# Patient Record
Sex: Female | Born: 1950
Health system: Southern US, Community
[De-identification: ages and names within clinical notes are randomized; demographics above are authoritative.]

## PROBLEM LIST (undated history)

## (undated) DIAGNOSIS — F419 Anxiety disorder, unspecified: Secondary | ICD-10-CM

## (undated) DIAGNOSIS — R002 Palpitations: Secondary | ICD-10-CM

## (undated) DIAGNOSIS — I447 Left bundle-branch block, unspecified: Secondary | ICD-10-CM

## (undated) DIAGNOSIS — K219 Gastro-esophageal reflux disease without esophagitis: Secondary | ICD-10-CM

## (undated) DIAGNOSIS — J45909 Unspecified asthma, uncomplicated: Secondary | ICD-10-CM

## (undated) DIAGNOSIS — J302 Other seasonal allergic rhinitis: Secondary | ICD-10-CM

## (undated) HISTORY — DX: Left bundle-branch block, unspecified: I44.7

## (undated) HISTORY — PX: EYE SURGERY: SHX253

## (undated) HISTORY — DX: Other seasonal allergic rhinitis: J30.2

## (undated) HISTORY — DX: Unspecified asthma, uncomplicated: J45.909

## (undated) HISTORY — DX: Gastro-esophageal reflux disease without esophagitis: K21.9

## (undated) HISTORY — DX: Palpitations: R00.2

---

## 1998-09-24 ENCOUNTER — Other Ambulatory Visit: Admission: RE | Admit: 1998-09-24 | Discharge: 1998-09-24 | Payer: Self-pay | Admitting: Obstetrics & Gynecology

## 1999-10-15 ENCOUNTER — Other Ambulatory Visit: Admission: RE | Admit: 1999-10-15 | Discharge: 1999-10-15 | Payer: Self-pay | Admitting: Obstetrics & Gynecology

## 2000-10-26 ENCOUNTER — Other Ambulatory Visit: Admission: RE | Admit: 2000-10-26 | Discharge: 2000-10-26 | Payer: Self-pay | Admitting: Obstetrics & Gynecology

## 2001-10-31 ENCOUNTER — Other Ambulatory Visit: Admission: RE | Admit: 2001-10-31 | Discharge: 2001-10-31 | Payer: Self-pay | Admitting: Obstetrics & Gynecology

## 2002-11-10 ENCOUNTER — Other Ambulatory Visit: Admission: RE | Admit: 2002-11-10 | Discharge: 2002-11-10 | Payer: Self-pay | Admitting: Obstetrics & Gynecology

## 2003-11-14 ENCOUNTER — Other Ambulatory Visit: Admission: RE | Admit: 2003-11-14 | Discharge: 2003-11-14 | Payer: Self-pay | Admitting: Obstetrics & Gynecology

## 2004-12-09 ENCOUNTER — Other Ambulatory Visit: Admission: RE | Admit: 2004-12-09 | Discharge: 2004-12-09 | Payer: Self-pay | Admitting: Obstetrics & Gynecology

## 2008-03-26 HISTORY — PX: OTHER SURGICAL HISTORY: SHX169

## 2009-08-28 HISTORY — PX: OTHER SURGICAL HISTORY: SHX169

## 2012-11-07 ENCOUNTER — Encounter: Payer: Self-pay | Admitting: Cardiology

## 2012-11-08 ENCOUNTER — Encounter: Payer: Self-pay | Admitting: Cardiology

## 2012-11-08 ENCOUNTER — Ambulatory Visit (INDEPENDENT_AMBULATORY_CARE_PROVIDER_SITE_OTHER): Payer: 59 | Admitting: Cardiology

## 2012-11-08 VITALS — BP 110/70 | HR 80 | Ht 68.0 in | Wt 149.1 lb

## 2012-11-08 DIAGNOSIS — I447 Left bundle-branch block, unspecified: Secondary | ICD-10-CM

## 2012-11-08 DIAGNOSIS — I10 Essential (primary) hypertension: Secondary | ICD-10-CM

## 2012-11-08 DIAGNOSIS — J45909 Unspecified asthma, uncomplicated: Secondary | ICD-10-CM

## 2012-11-08 DIAGNOSIS — R0609 Other forms of dyspnea: Secondary | ICD-10-CM

## 2012-11-08 DIAGNOSIS — E785 Hyperlipidemia, unspecified: Secondary | ICD-10-CM

## 2012-11-08 DIAGNOSIS — J309 Allergic rhinitis, unspecified: Secondary | ICD-10-CM

## 2012-11-08 DIAGNOSIS — J302 Other seasonal allergic rhinitis: Secondary | ICD-10-CM

## 2012-11-08 NOTE — Patient Instructions (Signed)
I agree with you -- I think your shortness of breath is "allergy" and reative airway disease related.  I think we need to let the treatment that your PCP started run its course.  We can reassess in ~ 3 months.  If you are still having symptoms then, I would consider a stress test evaluation.  You are due for having your cholesterol levels checked -- I have ordered them.  You can have them drawn @ the lab downstairs.   Marykay Lex, MD

## 2012-11-13 ENCOUNTER — Encounter: Payer: Self-pay | Admitting: Cardiology

## 2012-11-13 DIAGNOSIS — J45901 Unspecified asthma with (acute) exacerbation: Secondary | ICD-10-CM | POA: Insufficient documentation

## 2012-11-13 DIAGNOSIS — J302 Other seasonal allergic rhinitis: Secondary | ICD-10-CM | POA: Insufficient documentation

## 2012-11-13 DIAGNOSIS — I10 Essential (primary) hypertension: Secondary | ICD-10-CM | POA: Insufficient documentation

## 2012-11-13 DIAGNOSIS — I447 Left bundle-branch block, unspecified: Secondary | ICD-10-CM | POA: Insufficient documentation

## 2012-11-13 DIAGNOSIS — E785 Hyperlipidemia, unspecified: Secondary | ICD-10-CM | POA: Insufficient documentation

## 2012-11-13 NOTE — Assessment & Plan Note (Signed)
I recommended going back to PCP to see if she can get a inhaled/nasal steroid or rescue inhaler while her symptoms are significant.Marland Kitchen

## 2012-11-13 NOTE — Assessment & Plan Note (Signed)
On carvedilol and ARB, well-controlled.

## 2012-11-13 NOTE — Assessment & Plan Note (Signed)
Would simply treat the allergy season as if her H2 blockers become standing medications. This would avoid exacerbations of her RAD.

## 2012-11-13 NOTE — Assessment & Plan Note (Signed)
Being monitored by her primary physician. She is currently only on fish oil.

## 2012-11-13 NOTE — Assessment & Plan Note (Signed)
Most likely the symptoms are related to seasonal allergies and reactive airways disease. I recommend that she continue to use staining doses of H2 blockers. She may benefit from inhaled nasal steroids. She does not have a when necessary inhaler, which could be considered.  I will let these symptoms rather coarse, reassess in 3 months to see if her symptoms are improved. If not would consider evaluated with stress test just to make sure there is no cardiac etiology involved.

## 2012-11-13 NOTE — Progress Notes (Signed)
CARDIOLOGY CLINIC NOTE  PCP: Juliette Alcide, MD  Clinic Note: Chief Complaint  Patient presents with  . Annual Exam    no chest pain , no sob, no swelling,walking everyday,asthma   HPI: Jean Rivas is a 62 y.o. female with a PMH below who presents today for annual followup. I saw her in October of 2013. She was initially evaluated for a Left Bundle Branch Block, and had an echocardiogram done as well as a Myoview done. The workup was relatively normal. She is here just for routine followup of risk factors..  Interval History: Her major issue today is that she had some dyspnea and difficulty breathing with wheezing and sneezinG. She went to her PCP to be evaluated and received a prescription for nasal spray. This helped some but not that much up until this evening walking more so than she had before up to about 1 mile a day. She really denied any chest discomfort or dyspnea with that until the allergies kicked in. She just notices the postnasal drip and coughing and feeling the settling of mucus in her chest.  The remainder of Cardiovascular ROS is as follows: no chest pain or dyspnea on exertion positive for - dyspnea on exertion negative for - edema, irregular heartbeat, loss of consciousness, murmur, orthopnea, palpitations, paroxysmal nocturnal dyspnea, rapid heart rate or shortness of breath : Additional cardiac review of systems: Lightheadedness/ dizziness  - mild symptoms do to her postnasal drip and congestion symptoms., No syncope/near-syncope; TIA/amaurosis fugax - no Melena - no, hematochezia no; hematuria - no; nosebleeds - no; claudication - no  Past Medical History  Diagnosis Date  . LBBB (left bundle branch block)   . GERD (gastroesophageal reflux disease)   . Seasonal allergies   . RAD (reactive airway disease)      exacerbated by allergies    Prior Cardiac Evaluation and Past Surgical History: Past Surgical History  Procedure Laterality Date  . Lexiscan myoview   03/26/2008    Mild ischemia in apical regions, post-stress EF 67%, nondiagnostic EKG, low-risk abnormal study  . 2d echocardiogram  08/28/2009    EF 45-50%, impaired (stage1) diastolic dysfunction, mild regurg of the mitral and tricuspid valves    Allergies  Allergen Reactions  . Penicillins     Current Outpatient Prescriptions  Medication Sig Dispense Refill  . aspirin EC 81 MG tablet Take 81 mg by mouth daily.      . carvedilol (COREG) 3.125 MG tablet Take 3.125 mg by mouth 2 (two) times daily with a meal.      . cetirizine (ZYRTEC) 10 MG tablet Take 10 mg by mouth daily.      Marland Kitchen esomeprazole (NEXIUM) 20 MG capsule Take 20 mg by mouth daily before breakfast.      . fish oil-omega-3 fatty acids 1000 MG capsule Take 1 g by mouth daily.      . fluticasone (FLONASE) 50 MCG/ACT nasal spray Place 2 sprays into the nose daily.      Marland Kitchen losartan (COZAAR) 50 MG tablet Take 50 mg by mouth daily.      . ranitidine (ZANTAC) 150 MG tablet Take 150 mg by mouth at bedtime.      Marland Kitchen PROAIR HFA 108 (90 BASE) MCG/ACT inhaler prn       No current facility-administered medications for this visit.    History   Social History Narrative  . No narrative on file    ROS: A comprehensive Review of Systems - Negative except pertinent positives  noted below ENT ROS: positive for - nasal congestion, nasal discharge and sneezing Allergy and Immunology ROS: positive for - itchy/watery eyes, nasal congestion, postnasal drip and seasonal allergies Respiratory ROS: positive for - cough, shortness of breath and wheezing GI: Indigestion with gas pains/bloating.  PHYSICAL EXAM BP 110/70  Pulse 80  Ht 5\' 8"  (1.727 m)  Wt 149 lb 1.6 oz (67.631 kg)  BMI 22.68 kg/m2 General appearance: alert, cooperative, appears stated age, no distress and Healthy-appearing. Well-nourished and well-groomed. Answers questions appropriately. Oriented x3 Neck: no carotid bruit, no JVD and supple, symmetrical, trachea midline Lungs:  clear to auscultation bilaterally, normal percussion bilaterally and With some a mild end expiratory wheezing with interstitial sounds. No rales or rhonchi Heart: regular rate and rhythm, S1 normal with split S2, no murmur, click, rub or gallop and normal apical impulse Abdomen: soft, non-tender; bowel sounds normal; no masses,  no organomegaly Extremities: extremities normal, atraumatic, no cyanosis or edema Pulses: 2+ and symmetric Neurologic: Grossly normal HEENT: [place]/AT, EOMI, MMM, anicteric sclera; tenderness to palpation along the frontal and maxillary sinuses  JYN:[id number] today: Yes Rate: 80 , Rhythm: NSR, LBBB;    Recent Labs: Most recent lipids from within the year: TC 139, TG 97, HDL 54, LDL 76 (followed by PCP)  ASSESSMENT / PLAN: Dyspnea on exertion Most likely the symptoms are related to seasonal allergies and reactive airways disease. I recommend that she continue to use staining doses of H2 blockers. She may benefit from inhaled nasal steroids. She does not have a when necessary inhaler, which could be considered.  I will let these symptoms rather coarse, reassess in 3 months to see if her symptoms are improved. If not would consider evaluated with stress test just to make sure there is no cardiac etiology involved.  Exacerbation of reactive airway disease I recommended going back to PCP to see if she can get a inhaled/nasal steroid or rescue inhaler while her symptoms are significant..  Seasonal allergies Would simply treat the allergy season as if her H2 blockers become standing medications. This would avoid exacerbations of her RAD.  Dyslipidemia Being monitored by her primary physician. She is currently only on fish oil.  Hypertension, essential, benign On carvedilol and ARB, well-controlled.   Orders Placed This Encounter  Procedures  . Lipid panel    Order Specific Question:  Has the patient fasted?    Answer:  Yes  . Comprehensive metabolic panel    Order  Specific Question:  Has the patient fasted?    Answer:  Yes  . EKG 12-Lead   Meds ordered this encounter  Medications  . cetirizine (ZYRTEC) 10 MG tablet    Sig: Take 10 mg by mouth daily.  . fluticasone (FLONASE) 50 MCG/ACT nasal spray    Sig: Place 2 sprays into the nose daily.  Marland Kitchen esomeprazole (NEXIUM) 20 MG capsule    Sig: Take 20 mg by mouth daily before breakfast.  . ranitidine (ZANTAC) 150 MG tablet    Sig: Take 150 mg by mouth at bedtime.  Marland Kitchen PROAIR HFA 108 (90 BASE) MCG/ACT inhaler    Sig: prn   Followup: 3 months to reassess symptoms.  Anaysha Andre W. Herbie Baltimore, M.D., M.S. [facility] [address] [place], [place]  [address]  [phone] Pager # [phone]

## 2012-11-24 ENCOUNTER — Other Ambulatory Visit: Payer: Self-pay | Admitting: Cardiology

## 2012-11-24 NOTE — Telephone Encounter (Signed)
Rx was sent to pharmacy electronically. 

## 2012-12-20 ENCOUNTER — Encounter (HOSPITAL_COMMUNITY): Payer: Self-pay | Admitting: Emergency Medicine

## 2012-12-20 ENCOUNTER — Emergency Department (HOSPITAL_COMMUNITY)
Admission: EM | Admit: 2012-12-20 | Discharge: 2012-12-20 | Disposition: A | Discharge status: Home / Self Care | Payer: 59 | Attending: Emergency Medicine | Admitting: Emergency Medicine

## 2012-12-20 DIAGNOSIS — K219 Gastro-esophageal reflux disease without esophagitis: Secondary | ICD-10-CM | POA: Insufficient documentation

## 2012-12-20 DIAGNOSIS — Y9389 Activity, other specified: Secondary | ICD-10-CM | POA: Insufficient documentation

## 2012-12-20 DIAGNOSIS — IMO0002 Reserved for concepts with insufficient information to code with codable children: Secondary | ICD-10-CM | POA: Insufficient documentation

## 2012-12-20 DIAGNOSIS — Z7982 Long term (current) use of aspirin: Secondary | ICD-10-CM | POA: Insufficient documentation

## 2012-12-20 DIAGNOSIS — Z8679 Personal history of other diseases of the circulatory system: Secondary | ICD-10-CM | POA: Insufficient documentation

## 2012-12-20 DIAGNOSIS — Z79899 Other long term (current) drug therapy: Secondary | ICD-10-CM | POA: Insufficient documentation

## 2012-12-20 DIAGNOSIS — J45909 Unspecified asthma, uncomplicated: Secondary | ICD-10-CM | POA: Insufficient documentation

## 2012-12-20 DIAGNOSIS — Z880 Allergy status to penicillin: Secondary | ICD-10-CM | POA: Insufficient documentation

## 2012-12-20 DIAGNOSIS — M25519 Pain in unspecified shoulder: Secondary | ICD-10-CM | POA: Insufficient documentation

## 2012-12-20 DIAGNOSIS — F411 Generalized anxiety disorder: Secondary | ICD-10-CM | POA: Insufficient documentation

## 2012-12-20 DIAGNOSIS — M25512 Pain in left shoulder: Secondary | ICD-10-CM

## 2012-12-20 DIAGNOSIS — R000 Tachycardia, unspecified: Secondary | ICD-10-CM | POA: Insufficient documentation

## 2012-12-20 DIAGNOSIS — Y92410 Unspecified street and highway as the place of occurrence of the external cause: Secondary | ICD-10-CM | POA: Insufficient documentation
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
## 2012-12-24 NOTE — ED Provider Notes (Signed)
Medical screening examination/treatment/procedure(s) were performed by non-physician practitioner and as supervising physician I was immediately available for consultation/collaboration.  EKG Interpretation   None        Derwood Kaplan, MD 12/24/12 [phone]

## 2013-01-01 ENCOUNTER — Other Ambulatory Visit: Payer: Self-pay | Admitting: Cardiology

## 2013-02-09 ENCOUNTER — Other Ambulatory Visit: Payer: Self-pay | Admitting: Cardiology

## 2013-02-13 NOTE — Telephone Encounter (Signed)
Rx was sent to pharmacy electronically. 

## 2013-02-23 ENCOUNTER — Ambulatory Visit: Payer: 59 | Admitting: Cardiology

## 2013-06-14 ENCOUNTER — Encounter: Payer: Self-pay | Admitting: Cardiology

## 2013-06-14 ENCOUNTER — Ambulatory Visit (INDEPENDENT_AMBULATORY_CARE_PROVIDER_SITE_OTHER): Payer: 59 | Admitting: Cardiology

## 2013-06-14 VITALS — BP 116/62 | HR 83 | Ht 68.0 in | Wt 150.0 lb

## 2013-06-14 DIAGNOSIS — R002 Palpitations: Secondary | ICD-10-CM

## 2013-06-14 MED ORDER — METOPROLOL TARTRATE 25 MG PO TABS: 25.0000 mg | ORAL_TABLET | Freq: Two times a day (BID) | ORAL | Status: DC

## 2013-06-14 NOTE — Progress Notes (Signed)
Patient ID: Jean Rivas, female   DOB: Sep 25, 1950, 64 y.o.   MRN: [id number]    06/14/2013 Jean Rivas   [birth year]  [id number]  Primary Physicia Curlene Labrum, MD Primary Cardiologist: Dr. Ellyn Hack  HPI:  The patient is a 63 y/o female, followed by Dr. Ellyn Hack. She has a histor of HTN and HLD. She was initially referred to Dr. Ellyn Hack for evaluation for a Left Bundle Branch Block and had an echocardiogram done as well as a Myoview. The work-up was relatively normal. Her last OV with Dr. Ellyn Hack was 11/08/12 and she was felt to be stable from a heart standpoint at that time.  She presents to clinic today with a complaint of intermittent palpitations. These episodes occur frequently, almost daily. She notes occasional lightheadedness but denies syncope, near syncope, chest pain and SOB. She states that she has been under a bit more stress over the last few months than normal. She denies any excessive caffeine use and no known thyroid disorders.    EKG in the office today demonstrates sinus rhythm with frequent PVCs. Ventricular rate is 83 bpm. BP is 116/62.  Current Outpatient Prescriptions  Medication Sig Dispense Refill  . albuterol (PROVENTIL HFA;VENTOLIN HFA) 108 (90 BASE) MCG/ACT inhaler Inhale 1-2 puffs into the lungs every 6 (six) hours as needed for wheezing or shortness of breath.      Marland Kitchen aspirin EC 81 MG tablet Take 81 mg by mouth daily.      . budesonide (PULMICORT) 180 MCG/ACT inhaler Inhale 1 puff into the lungs 2 (two) times daily.      . cetirizine (ZYRTEC) 10 MG tablet Take 10 mg by mouth daily.      . Esomeprazole Magnesium (NEXIUM 24HR PO) Take by mouth.      . fluticasone (FLONASE) 50 MCG/ACT nasal spray Place 2 sprays into the nose daily.      . influenza vac split quadrivalent PF (FLUARIX) 0.5 ML injection Inject 0.5 mLs into the muscle once.      Marland Kitchen losartan (COZAAR) 50 MG tablet TAKE 1 TABLET BY MOUTH EVERY DAY  90 tablet  2  . ranitidine (ZANTAC) 150 MG tablet  Take 150 mg by mouth daily.       . metoprolol tartrate (LOPRESSOR) 25 MG tablet Take 1 tablet (25 mg total) by mouth 2 (two) times daily.  180 tablet  3   No current facility-administered medications for this visit.    Allergies  Allergen Reactions  . Penicillins Itching    History   Social History  . Marital Status: Unknown    Spouse Name: N/A    Number of Children: N/A  . Years of Education: N/A   Occupational History  . Not on file.   Social History Main Topics  . Smoking status: Never Smoker   . Smokeless tobacco: Not on file  . Alcohol Use: Yes     Comment: very seldom  . Drug Use: No  . Sexual Activity: Not on file   Other Topics Concern  . Not on file   Social History Narrative  . No narrative on file     Review of Systems: General: negative for chills, fever, night sweats or weight changes.  Cardiovascular: negative for chest pain, dyspnea on exertion, edema, orthopnea, palpitations, paroxysmal nocturnal dyspnea or shortness of breath Dermatological: negative for rash Respiratory: negative for cough or wheezing Urologic: negative for hematuria Abdominal: negative for nausea, vomiting, diarrhea, bright red blood per rectum, melena, or hematemesis Neurologic:  negative for visual changes, syncope, or dizziness All other systems reviewed and are otherwise negative except as noted above.    Blood pressure 116/62, pulse 83, height 5\' 8"  (1.727 m), weight 150 lb (68.04 kg).  General appearance: alert, cooperative and no distress Lungs: clear to auscultation bilaterally Heart: regular with PVCs Extremities: no LEE Pulses: 2+ and symmetric Skin: warm and dry Neurologic: Grossly normal  EKG Sinus Rhythm with frequent PVCs  ASSESSMENT AND PLAN:   Palpitations EKG demonstrates SR with frequent PVCs. She is currently on a BB. She is on 3.125 mg of Coreg. She needs greater beta blockage. I am hesitant to increase her Coreg, as I want to avoid low BP. Her SBP  today is 116. Her HR is 83. I have elected to switch her to Lopressor, as I feel this will have a greater effect on HR than BP. Will start 25 mg BID. I also suggested a heart monitor to evaluate for any other possible arrhthymias, however she opted to see if her condition improves after medication adjustments. If she continues to have issues, she will return in 2 weeks and we will reconsider the need for a Holter monitor.     PLAN  Will change BB from Coreg to Lopressor for better HR control, in an effort to decrease frequency of PVCs. She was on 3.125 mg of Coreg, will change to 25 mg of Lopressor BID. Pt was instructed to decrease to 12.5 mg if she develops bradycardia and or hypotension.   Brittainy SimmonsPA-C 06/14/2013 9:19 AM

## 2013-06-14 NOTE — Patient Instructions (Signed)
Your physician recommends that you schedule a follow-up appointment in:  2 weeks with Ellen Henri PA  Stop taking your Coreg  Start taking Metoprolol 25 mg Two times a day  Let us know if your Top number drops below  95 or if your heart rate drops below  55 beats per minute if any of  These two things happen then you will need to lower your dosage to 12.5 Two times a day

## 2013-06-14 NOTE — Assessment & Plan Note (Signed)
EKG demonstrates SR with frequent PVCs. She is currently on a BB. She is on 3.125 mg of Coreg. She needs greater beta blockage. I am hesitant to increase her Coreg, as I want to avoid low BP. Her SBP today is 116. Her HR is 83. I have elected to switch her to Lopressor, as I feel this will have a greater effect on HR than BP. Will start 25 mg BID. I also suggested a heart monitor to evaluate for any other possible arrhthymias, however she opted to see if her condition improves after medication adjustments. If she continues to have issues, she will return in 2 weeks and we will reconsider the need for a Holter monitor.

## 2013-06-15 ENCOUNTER — Telehealth: Payer: Self-pay | Admitting: Cardiology

## 2013-06-15 NOTE — Telephone Encounter (Signed)
Was in on yesterday and was told if her bp dropped below 95 to called , well it dropped to 94/65 last night and she took half a pill and it seems to be doing better . And wants to know if she should continue with the Losartan .Jean Rivas   Thanks

## 2013-06-15 NOTE — Telephone Encounter (Signed)
Returned call and female answering stated pt is at work.  Gave alternate number: [phone].  Call to work number and female answering stated "she's around here somewhere" and transferred to voicemail.    Call back to home number and spoke w/ pt's husband, Albertina Parr.  Asked if he would inform pt her call was returned and have her call back when message received.  He agreed.  Per OV note, pt was to decrease metoprolol tart to 12.5 mg BID if BP dropped below 95 or HR below 55.    Message forwarded to Lyda Jester, PA-C for further instructions regarding losartan.

## 2013-06-15 NOTE — Telephone Encounter (Signed)
Patient called .Marland Kitchen Please return call at [phone]

## 2013-07-03 ENCOUNTER — Ambulatory Visit (INDEPENDENT_AMBULATORY_CARE_PROVIDER_SITE_OTHER): Payer: 59 | Admitting: Cardiology

## 2013-07-03 ENCOUNTER — Encounter: Payer: Self-pay | Admitting: Cardiology

## 2013-07-03 VITALS — BP 110/60 | HR 78 | Ht 68.0 in | Wt 149.0 lb

## 2013-07-03 DIAGNOSIS — R002 Palpitations: Secondary | ICD-10-CM

## 2013-07-03 MED ORDER — METOPROLOL TARTRATE 25 MG PO TABS: 25.0000 mg | ORAL_TABLET | Freq: Two times a day (BID) | ORAL | Status: DC

## 2013-07-03 MED ORDER — LOSARTAN POTASSIUM 25 MG PO TABS: 25.0000 mg | ORAL_TABLET | Freq: Every day | ORAL | Status: DC

## 2013-07-03 NOTE — Assessment & Plan Note (Signed)
The patient had to reduce her Lopressor from 25 mg twice a day down to 12.5 mg, due to hypotension. She felt that the increase dose of Lopressor helped her symptoms initially, however now on the lower dose, she has noticed increased frequency of her palpitations. Her BP today in clinic is 110/60. To better control her symptoms, we will decrease her Cozaar from 50 mg a day down to 25 mg a day. This will allow more room with blood pressure to increase her Lopressor dose back to 25 mg twice a day.

## 2013-07-03 NOTE — Patient Instructions (Signed)
Decrease your Losartan (Cozzar) to 25mg  Daily Increase your Lopressor to 25mg  twice daily  Follow up with Dr.Harding in September 2015

## 2013-07-03 NOTE — Progress Notes (Signed)
Patient ID: Jean Rivas, female   DOB: Sep 30, 1950, 63 y.o.   MRN: [id number]    07/03/2013 Jean Rivas   1950-11-04  [id number]  Primary Physicia Curlene Labrum, MD Primary Cardiologist: Dr. Ellyn Hack  HPI:  The patient is a 63 y/o female, followed by Dr. Ellyn Hack. She has a histor of HTN and HLD. She was initially referred to Dr. Ellyn Hack for evaluation for a Left Bundle Branch Block and had an echocardiogram done as well as a Myoview. The work-up was relatively normal.   I evaluated her on 06/14/13. She presented to clinic with complaints of intermittent palpitations. An EKG in the office that day demonstrated sinus rhythm with frequent PVCs. Ventricular rate was 83 beats per minute. Blood pressure was 116/62. She denied CP, SOB, syncope/near syncope. I offered to further evaluate her palpitations with an event monitor, however the patient refused. In an effort to reduce the frequency of PVCs and palpitations, I decided to change her beta blocker from Coreg to Lopressor, for better rate control. I instructed her to take 25 mg of Lopressor twice a day and to check heart rate and blood pressure and to notify our office if she developed any hypotension or bradycardia. She was instructed to return back in 2-3 weeks for followup.  She presents back to clinic today for followup. She tells me that she had to reduce her Lopressor to 12.5 mg twice a day, due to borderline hypotension. She reports systolic blood pressures in the upper 80s - low 90s, although she was asymptomatic. She does feel however that the change in medication decreased the frequency of her palpitations. However now that she is on the lower dose of 12.5 mg she has noticed an increase in the recurrence of her symptoms. She denies any excessive caffeine use, but states that she has been a little anxious over the last several months, due to the stress of her job. She denies any dizziness, syncope/near syncope, chest pain and shortness of  breath.    Current Outpatient Prescriptions  Medication Sig Dispense Refill  . albuterol (PROVENTIL HFA;VENTOLIN HFA) 108 (90 BASE) MCG/ACT inhaler Inhale 1-2 puffs into the lungs every 6 (six) hours as needed for wheezing or shortness of breath.      Marland Kitchen aspirin EC 81 MG tablet Take 81 mg by mouth daily.      . budesonide (PULMICORT) 180 MCG/ACT inhaler Inhale 1 puff into the lungs 2 (two) times daily.      . cetirizine (ZYRTEC) 10 MG tablet Take 10 mg by mouth daily.      . Esomeprazole Magnesium (NEXIUM 24HR PO) Take by mouth.      . fluticasone (FLONASE) 50 MCG/ACT nasal spray Place 2 sprays into the nose daily.      . influenza vac split quadrivalent PF (FLUARIX) 0.5 ML injection Inject 0.5 mLs into the muscle once.      Marland Kitchen losartan (COZAAR) 25 MG tablet Take 1 tablet (25 mg total) by mouth daily.  90 tablet  3  . metoprolol tartrate (LOPRESSOR) 25 MG tablet Take 1 tablet (25 mg total) by mouth 2 (two) times daily.  180 tablet  3  . ranitidine (ZANTAC) 150 MG tablet Take 150 mg by mouth daily.        No current facility-administered medications for this visit.    Allergies  Allergen Reactions  . Penicillins Itching    History   Social History  . Marital Status: Unknown    Spouse Name: N/A  Number of Children: N/A  . Years of Education: N/A   Occupational History  . Not on file.   Social History Main Topics  . Smoking status: Never Smoker   . Smokeless tobacco: Not on file  . Alcohol Use: Yes     Comment: very seldom  . Drug Use: No  . Sexual Activity: Not on file   Other Topics Concern  . Not on file   Social History Narrative  . No narrative on file     Review of Systems: General: negative for chills, fever, night sweats or weight changes.  Cardiovascular: negative for chest pain, dyspnea on exertion, edema, orthopnea, palpitations, paroxysmal nocturnal dyspnea or shortness of breath Dermatological: negative for rash Respiratory: negative for cough or  wheezing Urologic: negative for hematuria Abdominal: negative for nausea, vomiting, diarrhea, bright red blood per rectum, melena, or hematemesis Neurologic: negative for visual changes, syncope, or dizziness All other systems reviewed and are otherwise negative except as noted above.    Blood pressure 110/60, pulse 78, height 5\' 8"  (1.727 m), weight 149 lb (67.586 kg).  General appearance: alert, cooperative and no distress Lungs: clear to auscultation bilaterally Heart: RRR Extremities: no LEE Pulses: 2+ and symmetric Skin: warm and dry Neurologic: Grossly normal  EKG not performed  ASSESSMENT AND PLAN:   Palpitations The patient had to reduce her Lopressor from 25 mg twice a day down to 12.5 mg, due to hypotension. She felt that the increase dose of Lopressor helped her symptoms initially, however now on the lower dose, she has noticed increased frequency of her palpitations. Her BP today in clinic is 110/60. To better control her symptoms, we will decrease her Cozaar from 50 mg a day down to 25 mg a day. This will allow more room with blood pressure to increase her Lopressor dose back to 25 mg twice a day.    PLAN  Decrease Cozaar down to 25 mg daily to allow more room with blood pressure, in order to titrate Lopressor back to 25 mg twice a day. Patient  was instructed to monitor heart rate and blood pressure over the next few days. She is to followup if symptoms worsen or fail to improve or if she has any issues with heart rate and blood pressure. Otherwise, followup with Dr. Ellyn Hack in September for her yearly routine assessment.   Brittainy SimmonsPA-C 07/03/2013 12:47 PM

## 2013-10-30 ENCOUNTER — Ambulatory Visit (INDEPENDENT_AMBULATORY_CARE_PROVIDER_SITE_OTHER): Payer: 59 | Admitting: Cardiology

## 2013-10-30 ENCOUNTER — Encounter: Payer: Self-pay | Admitting: Cardiology

## 2013-10-30 VITALS — BP 102/60 | HR 63 | Ht 68.0 in | Wt 153.3 lb

## 2013-10-30 DIAGNOSIS — R002 Palpitations: Secondary | ICD-10-CM

## 2013-10-30 DIAGNOSIS — R0989 Other specified symptoms and signs involving the circulatory and respiratory systems: Secondary | ICD-10-CM

## 2013-10-30 DIAGNOSIS — I447 Left bundle-branch block, unspecified: Secondary | ICD-10-CM

## 2013-10-30 DIAGNOSIS — R0609 Other forms of dyspnea: Secondary | ICD-10-CM

## 2013-10-30 DIAGNOSIS — I10 Essential (primary) hypertension: Secondary | ICD-10-CM

## 2013-10-30 NOTE — Progress Notes (Addendum)
PCP: Curlene Labrum, MD  Clinic Note: Chief Complaint  Patient presents with  . 4 month visit    no chest pain , no sob , no edema     HPI: Jean Rivas is a 63 y.o. female with a PMH below who presents today for what we will follow up with me is a 6-month followup from her visit with Lyda Jester P.A.-C. for palpitations. She initially saw Brittainy in April with complaints of intermittent palpitations that were becoming more and more symptomatic. She was noted to have frequent PVCs on EKG in the office visit. She refused evaluation with an event monitor at that time.  At that time her beta blocker was changed from carvedilol to Lopressor 25 mg twice a day, that had to be reduced to 12.5 mg twice a day for borderline hypotension and dizziness. Apparently her blood pressures were going down to the 80s and 90s mmHg.  she did note that her beta blocker had reduced her palpitations at the time. Notably, she was frustrated that she did not get to see a physician at that time. Despite filling the PVCs she did not complaining of syncope or near syncope any dyspnea or heart failure symptoms that required urgent evaluation.  Past Medical History  Diagnosis Date  . LBBB (left bundle branch block)     Mildly reduced LVEF ~45-50%, Gr 1 DD, Mild MR & TR; 2010 Myoivew read as "mild" aical ischemia - LOW RISK (not acted upon)  . GERD (gastroesophageal reflux disease)   . Seasonal allergies   . RAD (reactive airway disease)      exacerbated by allergies  . Heart palpitations    Prior Cardiac Evaluation and Past Surgical History: Past Surgical History  Procedure Laterality Date  . Lexiscan myoview  03/26/2008    Mild ischemia in apical regions, post-stress EF 67%, nondiagnostic EKG, low-risk abnormal study  . 2d echocardiogram  08/28/2009    EF 45-50%, impaired (stage1) diastolic dysfunction, mild regurg of the mitral and tricuspid valves   Interval History: Today she comes in still noting  frequent PVCs and less frequent than before. She does note it more when eating certain foods. She says that burping or drinking cold water will help his episodes. Sometimes they last more than just one or 2 beats. When she has the palpitations they come in spells with this several minutes of the sensations that make her feel really good for while she is having them. She doesn't feel that she's had passed out, she just feels dizzy and nauseated. She gets a tightness sensation in her chest when they come on and do go away. Without the palpitations, she denies any chest tightness or pressure with rest or exertion. Whether without palpitations he denies any syncope or near syncope. No PND, orthopnea or edema.  ROS: A comprehensive Review of Systems - was performed Review of Systems  HENT: Negative for congestion and nosebleeds.   Eyes: Negative for blurred vision and double vision.  Respiratory: Negative.  Negative for cough, hemoptysis, sputum production, shortness of breath and wheezing.   Cardiovascular: Positive for palpitations. Negative for claudication.  Gastrointestinal: Negative for blood in stool and melena.  Genitourinary: Negative.  Negative for hematuria.  Neurological: Positive for dizziness. Negative for sensory change, speech change, focal weakness, seizures, loss of consciousness and headaches.  Endo/Heme/Allergies: Negative.   Psychiatric/Behavioral: Negative for depression. The patient is nervous/anxious.   All other systems reviewed and are negative.  MEDICATIONS AND ALLERGIES REVIEWED  IN EPIC -- No change SOCIAL AND FAMILY HISTORY REVIEWED IN EPIC -- no change  Wt Readings from Last 3 Encounters:  10/30/13 153 lb 4.8 oz (69.536 kg)  07/03/13 149 lb (67.586 kg)  06/14/13 150 lb (68.04 kg)    PHYSICAL EXAM BP 102/60  Pulse 63  Ht 5\' 8"  (1.727 m)  Wt 153 lb 4.8 oz (69.536 kg)  BMI 23.31 kg/m2 General appearance: alert &Oriented x3,  cooperative, appears stated age, no  distress and Healthy-appearing. Well-nourished and well-groomed. Answers questions appropriately.  HEENT: [place]/AT, EOMI, MMM, anicteric sclera; Neck: no carotid bruit, no JVD and supple, symmetrical, trachea midline  Lungs: CTAB, normal percussion bilaterally and With some a mild end expiratory wheezing with interstitial sounds. No rales or rhonchi  Heart: RRR (with ectopy), cysts S1 normal with split S2, no murmur, click, rub or gallop and normal apical impulse  Abdomen: soft, non-tender; bowel sounds normal; no masses, no organomegaly  Extremities: extremities normal, atraumatic, no cyanosis or edema  Pulses: 2+ and symmetric  Neurologic: Grossly normal    Adult ECG Report  Rate: 63 ;  Rhythm: normal sinus rhythm and LBBB.  Narrative Interpretation: Otherwise normal / stable EKG.  Recent Labs:  None available.   ASSESSMENT / PLAN: Palpitations Most likely consistent with PVCs. She be having bigeminy or trigeminy or even short bursts of PSVT. Unfortunately, she still does not want to wear a monitor. She is close to retirement and wants to wait until she gets onto Medicare. That way she does not pay out of pocket. She also does not to be disrupted at work wearing a monitor. We talked about various vagal maneuvers that may help. The fact that her pain and drinking cold water makes it better would suggest the possibility of vagal sensitivity. This could suggest a possible SVT. We discussed the vagal maneuvers that can be used if the symptoms persist. I also said she did get extra half dose of metoprolol as needed if there is a bad spell. We will followup in January to reassess her symptoms. If things get really bad she is agreed that we should however were monitored in order to truly capture of his arrhythmias. Thankfully she has not had syncopal or near syncopal episodes.  Hypertension, essential, benign She is on losartan/Cozaar as well as metoprolol. I think if we need more metoprolol for rate  control, I would simply cut the Cozaar in half.  LBBB (left bundle branch block) This evaluated and no signs of ischemia. I wonder to reduce EF is excessive difficulty reading wall motions with the left bundle branch block. Her with her having potentially PACs or PVCs will be difficult to tell the difference. She may be more symptomatic of premature beats because of the bundle branch block.  Dyspnea on exertion Not really noticing any more exertional dyspnea.    Orders Placed This Encounter  Procedures  . EKG 12-Lead   No orders of the defined types were placed in this encounter.    Followup: ~4 months   HARDING,DAVID W, M.D., M.S. Interventional Cardiologist   Pager # [phone]

## 2013-10-30 NOTE — Patient Instructions (Signed)
CUT BACK ON SUGARS If episodes of irregular beats may take an extra 1/2 tablet metoprolol   If episodes lasting for 1- 2 minutes you may do Vagal manuevers-- cough , bear downs  Your physician wants you to follow-up in Jan 2016.  You will receive a reminder letter in the mail two months in advance. If you don't receive a letter, please call our office to schedule the follow-up appointment.

## 2013-11-02 ENCOUNTER — Encounter: Payer: Self-pay | Admitting: Cardiology

## 2013-11-02 NOTE — Assessment & Plan Note (Signed)
Not really noticing any more exertional dyspnea.

## 2013-11-02 NOTE — Assessment & Plan Note (Signed)
This evaluated and no signs of ischemia. I wonder to reduce EF is excessive difficulty reading wall motions with the left bundle branch block. Her with her having potentially PACs or PVCs will be difficult to tell the difference. She may be more symptomatic of premature beats because of the bundle branch block.

## 2013-11-02 NOTE — Assessment & Plan Note (Signed)
She is on losartan/Cozaar as well as metoprolol. I think if we need more metoprolol for rate control, I would simply cut the Cozaar in half.

## 2013-11-02 NOTE — Assessment & Plan Note (Signed)
Most likely consistent with PVCs. She be having bigeminy or trigeminy or even short bursts of PSVT. Unfortunately, she still does not want to wear a monitor. She is close to retirement and wants to wait until she gets onto Medicare. That way she does not pay out of pocket. She also does not to be disrupted at work wearing a monitor. We talked about various vagal maneuvers that may help. The fact that her pain and drinking cold water makes it better would suggest the possibility of vagal sensitivity. This could suggest a possible SVT. We discussed the vagal maneuvers that can be used if the symptoms persist. I also said she did get extra half dose of metoprolol as needed if there is a bad spell. We will followup in January to reassess her symptoms. If things get really bad she is agreed that we should however were monitored in order to truly capture of his arrhythmias. Thankfully she has not had syncopal or near syncopal episodes.

## 2013-11-26 ENCOUNTER — Other Ambulatory Visit: Payer: Self-pay | Admitting: Cardiology

## 2013-11-27 NOTE — Telephone Encounter (Signed)
Losartan dose decreased to 25mg  - sent in #90 tablet with 3 refills on 07/03/2013

## 2013-12-21 ENCOUNTER — Telehealth: Payer: Self-pay | Admitting: Cardiology

## 2013-12-21 NOTE — Telephone Encounter (Signed)
Does she need pre meds before dental work?

## 2013-12-21 NOTE — Telephone Encounter (Signed)
Spoke to patient She states she is having her teeth cleaned on Monday. She wanted to know if she needed to pre -med for dental.  RN informed apatient does not need pre med unless she had a valve issue.

## 2013-12-22 NOTE — Telephone Encounter (Signed)
No premeds needed.  [place]

## 2014-01-10 ENCOUNTER — Telehealth: Payer: Self-pay | Admitting: Cardiology

## 2014-01-10 NOTE — Telephone Encounter (Signed)
Pt Losartan Potassium  precription was called in for 25 mg,it should be 50 mg. Would you please call a new one in for the 50 [facility] to [phone].

## 2014-01-12 ENCOUNTER — Other Ambulatory Visit: Payer: Self-pay | Admitting: *Deleted

## 2014-01-12 MED ORDER — LOSARTAN POTASSIUM 25 MG PO TABS: 25.0000 mg | ORAL_TABLET | Freq: Every day | ORAL | Status: DC

## 2014-04-17 ENCOUNTER — Ambulatory Visit (INDEPENDENT_AMBULATORY_CARE_PROVIDER_SITE_OTHER): Payer: PPO | Admitting: Cardiology

## 2014-04-17 ENCOUNTER — Encounter: Payer: Self-pay | Admitting: Cardiology

## 2014-04-17 DIAGNOSIS — I10 Essential (primary) hypertension: Secondary | ICD-10-CM

## 2014-04-17 DIAGNOSIS — I447 Left bundle-branch block, unspecified: Secondary | ICD-10-CM

## 2014-04-17 DIAGNOSIS — R002 Palpitations: Secondary | ICD-10-CM

## 2014-04-17 DIAGNOSIS — R0609 Other forms of dyspnea: Secondary | ICD-10-CM

## 2014-04-17 NOTE — Patient Instructions (Signed)
Continue current medications  If you have a spell of palpitations, you may take an extra  1/2 dose of metoprolol but do not take losartan that evening.   Your physician wants you to follow-up in 6 month Dr Ellyn Hack.  You will receive a reminder letter in the mail two months in advance. If you don't receive a letter, please call our office to schedule the follow-up appointment.

## 2014-04-19 ENCOUNTER — Encounter: Payer: Self-pay | Admitting: Cardiology

## 2014-04-19 NOTE — Assessment & Plan Note (Signed)
Pretty well controlled. No PVCs or PACs noted on her EKG. Stable dose of Lopressor with when necessary additional 12.5 mg.  She did better with Lopressor to carvedilol. It does not sound like she is having any true arrhythmias.

## 2014-04-19 NOTE — Assessment & Plan Note (Signed)
Very stable. Likely multifactorial. I think she is somewhat deconditioned.

## 2014-04-19 NOTE — Progress Notes (Signed)
PCP: Curlene Labrum, MD  Clinic Note: Chief Complaint  Patient presents with  . Follow-up    4 Months follow-up, pt denied chest pain  . Palpitations    HPI: Jean Rivas is a 64 y.o. female,with a past medical history of left bundle branch block and reactive airways disease along with allergies who returns for six-month follow-up for heart palpitations. I last saw her in September 2015. She is finally stabilized out on a low-dose beta blocker (Lopressor) to maintain her palpitations and is stable manner. She never did wear a monitor. She was found to have PACs and PVCs on EKG.  Past Medical History  Diagnosis Date  . LBBB (left bundle branch block)     Mildly reduced LVEF ~45-50%, Gr 1 DD, Mild MR & TR; 2010 Myoivew read as "mild" aical ischemia - LOW RISK (not acted upon)  . GERD (gastroesophageal reflux disease)   . Seasonal allergies   . RAD (reactive airway disease)      exacerbated by allergies  . Heart palpitations    Prior Cardiac Evaluation and Past Surgical History: Past Surgical History  Procedure Laterality Date  . Lexiscan myoview  03/26/2008    Mild ischemia in apical regions, post-stress EF 67%, nondiagnostic EKG, low-risk abnormal study  . 2d echocardiogram  08/28/2009    EF 45-50%, impaired (stage1) diastolic dysfunction, mild regurg of the mitral and tricuspid valves   Interval History: She presents today saying that she is doing very well for last 2 weeks. Her palpitations are really been minimal over this period of time. She states that it by a month ago she had a spell that last little while and she took an extra dose of metoprolol and a calm down. She has not had any sensation of chest tightness or pressure associated with the palpitations. She denies any lightheadedness, dizziness, syncope/near syncope, TIA/amaurosis fugax associated with or without palpitations. Otherwise for cardiac standpoint she is essentially stable. She does get a bit short of breath  if she walks up a hill or if she gets stressed out. She has no problem walking on flat ground before at a moderate pace. She denies any heart failure symptoms of PND or orthopnea. No resting or exertional chest tightness or pressure.  ROS: A comprehensive Review of Systems - was performed Review of Systems  Constitutional: Negative for weight loss and malaise/fatigue.  HENT: Negative for nosebleeds.   Respiratory: Positive for shortness of breath (As noted above. Exertional dyspnea). Negative for cough.   Cardiovascular: Negative for claudication.  Gastrointestinal: Positive for heartburn (She has frequent episodes of heartburn/GERD that is alleviated with burping or taking Maalox). Negative for blood in stool and melena.  Genitourinary: Negative for hematuria.  Neurological: Negative for dizziness, loss of consciousness and headaches.  Endo/Heme/Allergies: Does not bruise/bleed easily.  Psychiatric/Behavioral: The patient is nervous/anxious.   All other systems reviewed and are negative.    Current Outpatient Prescriptions on File Prior to Visit  Medication Sig Dispense Refill  . albuterol (PROVENTIL HFA;VENTOLIN HFA) 108 (90 BASE) MCG/ACT inhaler Inhale 1-2 puffs into the lungs every 6 (six) hours as needed for wheezing or shortness of breath.    Marland Kitchen aspirin EC 81 MG tablet Take 81 mg by mouth daily.    . budesonide (PULMICORT) 180 MCG/ACT inhaler Inhale 1 puff into the lungs 2 (two) times daily.    . cetirizine (ZYRTEC) 10 MG tablet Take 10 mg by mouth daily.    Marland Kitchen losartan (COZAAR) 25 MG  tablet Take 1 tablet (25 mg total) by mouth daily. 90 tablet 1  . metoprolol tartrate (LOPRESSOR) 25 MG tablet Take 1 tablet (25 mg total) by mouth 2 (two) times daily. 180 tablet 3  . ranitidine (ZANTAC) 150 MG tablet Take 150 mg by mouth daily.      No current facility-administered medications on file prior to visit.   Allergies  Allergen Reactions  . Penicillins Itching    SOCIAL AND FAMILY  HISTORY REVIEWED IN EPIC -- no change  Wt Readings from Last 3 Encounters:  04/17/14 159 lb 3.2 oz (72.213 kg)  10/30/13 153 lb 4.8 oz (69.536 kg)  07/03/13 149 lb (67.586 kg)   PHYSICAL EXAM BP 112/68 mmHg  Pulse 60  Ht 5\' 8"  (1.727 m)  Wt 159 lb 3.2 oz (72.213 kg)  BMI 24.21 kg/m2 General appearance: A&O x3,  cooperative, appears stated age, no distress and Healthy-appearing. Well-nourished and well-groomed. Answers questions appropriately.  HEENT: [place]/AT, EOMI, MMM, anicteric sclera; Neck: no carotid bruit, no JVD and supple, symmetrical, trachea midline  Lungs: Mostly clear to auscultation with faint end extra wheezing in the mid lung fields. Otherwise nonlabored with good air movement Heart: RRR , S1 normal with split S2, no murmur, click, rub or gallop and normal apical impulse  Abdomen: soft, non-tender; bowel sounds normal; no masses, no organomegaly  Extremities: extremities normal, atraumatic, no cyanosis or edema  Pulses: 2+ and symmetric  Neurologic: Grossly normal    Adult ECG Report  Rate: 60;  Rhythm: normal sinus rhythm and LBBB.  Narrative Interpretation: Otherwise normal / stable EKG.  Recent Labs:  None available.   ASSESSMENT / PLAN: Palpitations Pretty well controlled. No PVCs or PACs noted on her EKG. Stable dose of Lopressor with when necessary additional 12.5 mg.  She did better with Lopressor to carvedilol. It does not sound like she is having any true arrhythmias.   Hypertension, essential, benign Adequate control on low-dose beta blocker. She is also taking low-dose losartan and is almost borderline hypotensive.  Continue current meds. I did tell her if she feels "swimmy headed or dizzy she can hold her losartan dose.   LBBB (left bundle branch block) Negative ischemic workup. I suspect that her mildly reduced EF is probably related to wall motion and left bundle-branch block. We will need to monitor for worsening EF if her symptoms  worsen.   Dyspnea on exertion Very stable. Likely multifactorial. I think she is somewhat deconditioned.    Orders Placed This Encounter  Procedures  . EKG 12-Lead   No orders of the defined types were placed in this encounter.    Followup: 6 months   Tegan Burnside, Leonie Green, M.D., M.S. Interventional Cardiologist   Pager # [phone]

## 2014-04-19 NOTE — Assessment & Plan Note (Signed)
Adequate control on low-dose beta blocker. She is also taking low-dose losartan and is almost borderline hypotensive.  Continue current meds. I did tell her if she feels "swimmy headed or dizzy she can hold her losartan dose.

## 2014-04-19 NOTE — Assessment & Plan Note (Signed)
Negative ischemic workup. I suspect that her mildly reduced EF is probably related to wall motion and left bundle-branch block. We will need to monitor for worsening EF if her symptoms worsen.

## 2014-07-12 ENCOUNTER — Other Ambulatory Visit: Payer: Self-pay | Admitting: Cardiology

## 2014-07-12 NOTE — Telephone Encounter (Signed)
Rx(s) sent to pharmacy electronically.  

## 2014-07-30 ENCOUNTER — Encounter (HOSPITAL_BASED_OUTPATIENT_CLINIC_OR_DEPARTMENT_OTHER): Payer: No Typology Code available for payment source

## 2014-08-05 ENCOUNTER — Other Ambulatory Visit: Payer: Self-pay | Admitting: Cardiology

## 2014-08-06 NOTE — Telephone Encounter (Signed)
Losartan refilled #90 with 3 refills 07/12/14

## 2014-09-19 ENCOUNTER — Other Ambulatory Visit: Payer: Self-pay | Admitting: Cardiology

## 2014-09-19 NOTE — Telephone Encounter (Signed)
REFILL 

## 2014-10-15 ENCOUNTER — Ambulatory Visit (INDEPENDENT_AMBULATORY_CARE_PROVIDER_SITE_OTHER): Payer: PPO | Admitting: Cardiology

## 2014-10-15 ENCOUNTER — Encounter: Payer: Self-pay | Admitting: Cardiology

## 2014-10-15 VITALS — BP 130/72 | HR 55 | Ht 68.0 in | Wt 153.0 lb

## 2014-10-15 DIAGNOSIS — J302 Other seasonal allergic rhinitis: Secondary | ICD-10-CM

## 2014-10-15 DIAGNOSIS — R002 Palpitations: Secondary | ICD-10-CM | POA: Diagnosis not present

## 2014-10-15 DIAGNOSIS — I10 Essential (primary) hypertension: Secondary | ICD-10-CM

## 2014-10-15 DIAGNOSIS — E785 Hyperlipidemia, unspecified: Secondary | ICD-10-CM

## 2014-10-15 NOTE — Patient Instructions (Addendum)
No changes with current medications   Your physician wants you to follow-up in  12 months with Dr Ellyn Hack. You will receive a reminder letter in the mail two months in advance. If you don't receive a letter, please call our office to schedule the follow-up appointment.

## 2014-10-15 NOTE — Progress Notes (Signed)
PCP: Curlene Labrum, MD  Clinic Note: Chief Complaint  Patient presents with  . 6 month follow up    Patient has no complaints.  . Palpitations    HPI: Jean Rivas is a 64 y.o. female,with a past medical history of left bundle branch block and reactive airways disease along with allergies who returns for six-month follow-up for heart palpitations.  I last saw her in March 2016.   She is finally stabilized out on a low-dose beta blocker (Lopressor) to maintain her palpitations and is stable manner. She never did wear a monitor. She was found to have PACs and PVCs on EKG.  Past Medical History  Diagnosis Date  . LBBB (left bundle branch block)     Mildly reduced LVEF ~45-50%, Gr 1 DD, Mild MR & TR; 2010 Myoivew read as "mild" aical ischemia - LOW RISK (not acted upon)  . GERD (gastroesophageal reflux disease)   . Seasonal allergies   . RAD (reactive airway disease)      exacerbated by allergies  . Heart palpitations    Prior Cardiac Evaluation and Past Surgical History: Past Surgical History  Procedure Laterality Date  . Lexiscan myoview  03/26/2008    Mild ischemia in apical regions, post-stress EF 67%, nondiagnostic EKG, low-risk abnormal study  . 2d echocardiogram  08/28/2009    EF 45-50%, impaired (stage1) diastolic dysfunction, mild regurg of the mitral and tricuspid valves   Interval History: Jean Rivas presents today for followup of her palpitations stating that they are much better now. She is also significantly cut down her coffee even the decaf coffee and decaf sodas. She is doing lots of aerobic exercise activity and doing pretty well with that. She can go up hill or upstairs and it will get a bit short of breath if she tries to go in and speak otherwise does relatively well. She has no problem walking on flat ground before at a moderate pace. She denies any heart failure symptoms of PND or orthopnea. No resting or exertional chest tightness or pressure.  She related to  some of her dyspnea to her deviated septum and feeling somewhat congested. She has mild left ankle swelling from an injury but not edema.  She has not had any sensation of chest tightness or pressure associated with the palpitations. She denies any lightheadedness, dizziness, syncope/near syncope, TIA/amaurosis fugax associated with or without palpitations. Otherwise for cardiac standpoint she is essentially stable.   ROS: A comprehensive Review of Systems - was performed Review of Systems  Constitutional: Negative for weight loss and malaise/fatigue.  HENT: Positive for congestion (deviated septum related nasal drainage). Negative for nosebleeds.   Respiratory: Positive for shortness of breath (As noted above. Exertional dyspnea -- climbing stairs). Negative for cough.   Cardiovascular: Negative for claudication.  Gastrointestinal: Positive for heartburn (She has frequent episodes of heartburn/GERD that is alleviated with burping or taking Maalox). Negative for blood in stool and melena.  Genitourinary: Negative for hematuria.  Neurological: Positive for dizziness (bending over & standing back up ). Negative for loss of consciousness and headaches.  Endo/Heme/Allergies: Does not bruise/bleed easily.  Psychiatric/Behavioral: The patient is nervous/anxious.   All other systems reviewed and are negative.    Current Outpatient Prescriptions on File Prior to Visit  Medication Sig Dispense Refill  . albuterol (PROVENTIL HFA;VENTOLIN HFA) 108 (90 BASE) MCG/ACT inhaler Inhale 1-2 puffs into the lungs every 6 (six) hours as needed for wheezing or shortness of breath.    Marland Kitchen aspirin  EC 81 MG tablet Take 81 mg by mouth daily.    . budesonide (PULMICORT) 180 MCG/ACT inhaler Inhale 1 puff into the lungs 2 (two) times daily.    . cetirizine (ZYRTEC) 10 MG tablet Take 10 mg by mouth daily.    Marland Kitchen losartan (COZAAR) 25 MG tablet Take 1 tablet (25 mg total) by mouth daily. 90 tablet 3  . metoprolol tartrate  (LOPRESSOR) 25 MG tablet TAKE 1 TABLET TWICE A DAY 180 tablet 3  . ranitidine (ZANTAC) 150 MG tablet Take 150 mg by mouth daily.      No current facility-administered medications on file prior to visit.   Allergies  Allergen Reactions  . Penicillins Itching   Social History  Substance Use Topics  . Smoking status: Never Smoker   . Smokeless tobacco: None  . Alcohol Use: Yes     Comment: very seldom   Family History  Problem Relation Age of Onset  . Diabetes Mother   . Lung disease Father     Contracted in [place]    Wt Readings from Last 3 Encounters:  10/15/14 153 lb (69.4 kg)  04/17/14 159 lb 3.2 oz (72.213 kg)  10/30/13 153 lb 4.8 oz (69.536 kg)   PHYSICAL EXAM BP 130/72 mmHg  Pulse 55  Ht 5\' 8"  (1.727 m)  Wt 153 lb (69.4 kg)  BMI 23.27 kg/m2 General appearance: A&O x3,  cooperative, appears stated age, no distress and Healthy-appearing. Well-nourished and well-groomed. Answers questions appropriately.  HEENT: [place]/AT, EOMI, MMM, anicteric sclera; Neck: no carotid bruit, no JVD and supple, symmetrical, trachea midline  Lungs: Mostly clear to auscultation with faint end extra wheezing in the mid lung fields. Otherwise nonlabored with good air movement Heart: RRR , S1 normal with split S2, no murmur, click, rub or gallop and normal apical impulse  Abdomen: soft, non-tender; bowel sounds normal; no masses, no organomegaly  Extremities: extremities normal, atraumatic, no cyanosis or edema  Pulses: 2+ and symmetric  Neurologic: Grossly normal    Adult ECG Report  Rate: 55;  Rhythm: sinus bradycardia and LBBB.  Narrative Interpretation: Otherwise normal / stable EKG.  Recent Labs:  None available.   ASSESSMENT / PLAN: Problem List Items Addressed This Visit    Dyslipidemia (Chronic)    Monitored by PCP - on fish oil.        Relevant Orders   EKG 12-Lead (Completed)   Hypertension, essential, benign - Primary (Chronic)    Well-controlled on low-dose beta blocker and  ARB No further "swimmy headed episodes" - but still a bit orthostatic on occasion  -- would not titrate meds further.      Relevant Orders   EKG 12-Lead (Completed)   Palpitations (Chronic)    Well-controlled on current dose of beta blocker.only a rare occurrences such as take the additional when necessary dose. She seemed to tolerate the Lopressor to carvedilol.      Relevant Orders   EKG 12-Lead (Completed)   Seasonal allergies    I think her allergies & deviated septum are playing a role in her sensation of dyspnea -- described as having a hard time taking a breath, not like classic exertional dyspnea.         Orders Placed This Encounter  Procedures  . EKG 12-Lead   No orders of the defined types were placed in this encounter.    No Medication changes.  Followup: 1 yr   Leonie Man, M.D., M.S. Interventional Cardiologist   Pager # [phone]

## 2014-10-17 ENCOUNTER — Encounter: Payer: Self-pay | Admitting: Cardiology

## 2014-10-17 NOTE — Assessment & Plan Note (Signed)
Monitored by PCP - on fish oil.

## 2014-10-17 NOTE — Assessment & Plan Note (Signed)
Well-controlled on low-dose beta blocker and ARB No further "swimmy headed episodes" - but still a bit orthostatic on occasion  -- would not titrate meds further.

## 2014-10-17 NOTE — Assessment & Plan Note (Signed)
I think her allergies & deviated septum are playing a role in her sensation of dyspnea -- described as having a hard time taking a breath, not like classic exertional dyspnea.

## 2014-10-17 NOTE — Assessment & Plan Note (Signed)
Well-controlled on current dose of beta blocker.only a rare occurrences such as take the additional when necessary dose. She seemed to tolerate the Lopressor to carvedilol.

## 2015-04-01 DIAGNOSIS — Z1231 Encounter for screening mammogram for malignant neoplasm of breast: Secondary | ICD-10-CM | POA: Diagnosis not present

## 2015-04-01 DIAGNOSIS — Z01419 Encounter for gynecological examination (general) (routine) without abnormal findings: Secondary | ICD-10-CM | POA: Diagnosis not present

## 2015-04-14 ENCOUNTER — Other Ambulatory Visit: Payer: Self-pay | Admitting: Cardiology

## 2015-04-15 DIAGNOSIS — J4520 Mild intermittent asthma, uncomplicated: Secondary | ICD-10-CM | POA: Diagnosis not present

## 2015-04-15 DIAGNOSIS — M7552 Bursitis of left shoulder: Secondary | ICD-10-CM | POA: Diagnosis not present

## 2015-04-15 DIAGNOSIS — F411 Generalized anxiety disorder: Secondary | ICD-10-CM | POA: Diagnosis not present

## 2015-04-15 NOTE — Telephone Encounter (Signed)
exercise

## 2015-05-13 DIAGNOSIS — J209 Acute bronchitis, unspecified: Secondary | ICD-10-CM | POA: Diagnosis not present

## 2015-07-11 DIAGNOSIS — K219 Gastro-esophageal reflux disease without esophagitis: Secondary | ICD-10-CM | POA: Diagnosis not present

## 2015-07-11 DIAGNOSIS — F411 Generalized anxiety disorder: Secondary | ICD-10-CM | POA: Diagnosis not present

## 2015-07-11 DIAGNOSIS — I454 Nonspecific intraventricular block: Secondary | ICD-10-CM | POA: Diagnosis not present

## 2015-07-23 DIAGNOSIS — F411 Generalized anxiety disorder: Secondary | ICD-10-CM | POA: Diagnosis not present

## 2015-07-23 DIAGNOSIS — K219 Gastro-esophageal reflux disease without esophagitis: Secondary | ICD-10-CM | POA: Diagnosis not present

## 2015-07-23 DIAGNOSIS — Z0000 Encounter for general adult medical examination without abnormal findings: Secondary | ICD-10-CM | POA: Diagnosis not present

## 2015-07-23 DIAGNOSIS — J4540 Moderate persistent asthma, uncomplicated: Secondary | ICD-10-CM | POA: Diagnosis not present

## 2015-08-07 ENCOUNTER — Other Ambulatory Visit: Payer: Self-pay | Admitting: Cardiology

## 2015-10-28 ENCOUNTER — Ambulatory Visit: Payer: PPO | Admitting: Cardiology

## 2015-10-29 DIAGNOSIS — Z6823 Body mass index (BMI) 23.0-23.9, adult: Secondary | ICD-10-CM | POA: Diagnosis not present

## 2015-10-29 DIAGNOSIS — K219 Gastro-esophageal reflux disease without esophagitis: Secondary | ICD-10-CM | POA: Diagnosis not present

## 2015-10-29 DIAGNOSIS — I454 Nonspecific intraventricular block: Secondary | ICD-10-CM | POA: Diagnosis not present

## 2015-10-29 DIAGNOSIS — J4540 Moderate persistent asthma, uncomplicated: Secondary | ICD-10-CM | POA: Diagnosis not present

## 2015-10-29 DIAGNOSIS — Z1389 Encounter for screening for other disorder: Secondary | ICD-10-CM | POA: Diagnosis not present

## 2015-10-29 DIAGNOSIS — F411 Generalized anxiety disorder: Secondary | ICD-10-CM | POA: Diagnosis not present

## 2015-11-18 ENCOUNTER — Encounter: Payer: Self-pay | Admitting: Cardiology

## 2015-11-18 ENCOUNTER — Ambulatory Visit (INDEPENDENT_AMBULATORY_CARE_PROVIDER_SITE_OTHER): Payer: PPO | Admitting: Cardiology

## 2015-11-18 VITALS — BP 110/68 | HR 67 | Ht 68.0 in | Wt 146.8 lb

## 2015-11-18 DIAGNOSIS — I10 Essential (primary) hypertension: Secondary | ICD-10-CM | POA: Diagnosis not present

## 2015-11-18 DIAGNOSIS — E785 Hyperlipidemia, unspecified: Secondary | ICD-10-CM

## 2015-11-18 DIAGNOSIS — R002 Palpitations: Secondary | ICD-10-CM | POA: Diagnosis not present

## 2015-11-18 DIAGNOSIS — I447 Left bundle-branch block, unspecified: Secondary | ICD-10-CM | POA: Diagnosis not present

## 2015-11-18 DIAGNOSIS — R0609 Other forms of dyspnea: Secondary | ICD-10-CM | POA: Diagnosis not present

## 2015-11-18 MED ORDER — METOPROLOL TARTRATE 25 MG PO TABS: 12.5000 mg | ORAL_TABLET | Freq: Two times a day (BID) | ORAL | 3 refills | Status: DC

## 2015-11-18 NOTE — Patient Instructions (Signed)
NO CHANGES WITH CURRENT MEDICATION     Your physician wants you to follow-up in 12 MONTHS WITH DR HARDING.You will receive a reminder letter in the mail two months in advance. If you don't receive a letter, please call our office to schedule the follow-up appointment.    If you need a refill on your cardiac medications before your next appointment, please call your pharmacy.  

## 2015-11-18 NOTE — Assessment & Plan Note (Signed)
Well-controlled on current medications. Only taking one half tablet of metoprolol tartrate twice a day. Okay to take additional dose when necessary. Continue to avoid caffeine.

## 2015-11-18 NOTE — Assessment & Plan Note (Signed)
Well-controlled on current medications. She still notes some orthostatic hypotension. Is taking losartan in the midday.

## 2015-11-18 NOTE — Progress Notes (Addendum)
PCP: Curlene Labrum, MD  Clinic Note: Chief Complaint  Patient presents with  . Follow-up    pt c/o SOB - no change from baseline  . Palpitations    HPI: Jean Rivas is a 65 y.o. female,with a past medical history of left bundle branch block and reactive airways disease along with allergies who returns for six-month follow-up for heart palpitations.  I last saw her in Aug 2016. --  She was finally stabilized out on a low-dose beta blocker (Lopressor) to maintain her palpitations and is stable manner. She never did wear a monitor. She was found to have PACs and PVCs on EKG.  No recent hospitalizations or studies  Interval History: Jean Rivas presents today for followup of her palpitations -- doing much better on BB.   Had a bad spell for a minute or 2 last PM - but really not much.  Used to note skipping & beating hard - but doing OK now.   + Orthostatic dizziness.  Still has GERD Sx - usually in AM.   Currently using Advair. Started after she had a bad episode of bronchitis following a trip to [place] this past March.  She can go up hill or upstairs and it will get a bit short of breath if she tries to go in and speak otherwise does relatively well. She has no problem walking on flat ground before at a moderate pace. She denies any heart failure symptoms of PND or orthopnea. No resting or exertional chest tightness or pressure.  She related to some of her dyspnea to her deviated septum and feeling somewhat congested. She has mild left ankle swelling from an injury but not edema.  She has not had any sensation of chest tightness or pressure associated with the palpitations. She denies any lightheadedness, dizziness, syncope/near syncope, TIA/amaurosis fugax associated with or without palpitations. Otherwise for cardiac standpoint she is essentially stable.   ROS: A comprehensive Review of Systems - was performed Review of Systems  Constitutional: Negative for malaise/fatigue and  weight loss.  HENT: Positive for congestion (deviated septum related nasal drainage; better with Advait). Negative for nosebleeds.        Post-nasal gtt; better when sleeps on her side  Respiratory: Positive for shortness of breath (As noted above. Exertional dyspnea -- climbing stairs). Negative for cough and wheezing.        Better with Advair  Cardiovascular: Negative for claudication.  Gastrointestinal: Positive for heartburn (She has frequent episodes of heartburn/GERD that is alleviated with burping or taking Maalox). Negative for blood in stool and melena.  Genitourinary: Negative for hematuria.  Musculoskeletal: Negative for joint pain and myalgias.  Neurological: Positive for dizziness (bending over & standing back up ). Negative for loss of consciousness and headaches.  Endo/Heme/Allergies: Does not bruise/bleed easily.  Psychiatric/Behavioral: The patient is not nervous/anxious.   All other systems reviewed and are negative.   Past Medical History:  Diagnosis Date  . GERD (gastroesophageal reflux disease)   . Heart palpitations   . LBBB (left bundle branch block)    Mildly reduced LVEF ~45-50%, Gr 1 DD, Mild MR & TR; 2010 Myoivew read as "mild" aical ischemia - LOW RISK (not acted upon)  . RAD (reactive airway disease)     exacerbated by allergies  . Seasonal allergies    Past Surgical History:  Procedure Laterality Date  . 2D Echocardiogram  08/28/2009   EF 45-50%, impaired (stage1) diastolic dysfunction, mild regurg of the mitral and tricuspid valves  .  Lexiscan Myoview  03/26/2008   Mild ischemia in apical regions, post-stress EF 67%, nondiagnostic EKG, low-risk abnormal study    Prior to Admission medications   Medication Sig Start Date Taking? Authorizing Provider  ADVAIR DISKUS 250-50 MCG/DOSE AEPB  10/07/15 Yes Historical Provider, MD  albuterol (PROVENTIL HFA;VENTOLIN HFA) 108 (90 BASE) MCG/ACT inhaler Inhale 1-2 puffs into the lungs every 6 (six) hours as needed for  wheezing or shortness of breath.  Yes Historical Provider, MD  aspirin EC 81 MG tablet Take 81 mg by mouth daily.  Yes Historical Provider, MD  cetirizine (ZYRTEC) 10 MG tablet Take 10 mg by mouth daily.  Yes Historical Provider, MD  esomeprazole (NEXIUM) 20 MG capsule Take 20 mg by mouth daily at 12 noon.  Yes Historical Provider, MD  losartan (COZAAR) 25 MG tablet TAKE 1 TABLET (25 MG TOTAL) BY MOUTH DAILY. 08/07/15 Yes Leonie Man, MD  metoprolol tartrate (LOPRESSOR) 25 MG tablet TAKE 1/2 (12.5 mg) TABLET TWICE A DAY 09/19/14 Yes Brittainy M Simmons, PA-C  ranitidine (ZANTAC) 150 MG tablet Take 150 mg by mouth daily.   Yes Historical Provider, MD    Allergies  Allergen Reactions  . Penicillins Itching   Social History  Substance Use Topics  . Smoking status: Never Smoker  . Smokeless tobacco: Never Used  . Alcohol use Yes     Comment: very seldom   Family History  Problem Relation Age of Onset  . Lung disease Father     Contracted in [place]  . Diabetes Mother     Wt Readings from Last 3 Encounters:  11/18/15 66.6 kg (146 lb 12.8 oz)  10/15/14 69.4 kg (153 lb)  04/17/14 72.2 kg (159 lb 3.2 oz)   PHYSICAL EXAM BP 110/68 (BP Location: Right Arm, Patient Position: Sitting, Cuff Size: Normal)   Pulse 67   Ht 5\' 8"  (1.727 m)   Wt 66.6 kg (146 lb 12.8 oz)   SpO2 98%   BMI 22.32 kg/m  General appearance: A&O x3,  cooperative, appears stated age, no distress and Healthy-appearing. Well-nourished and well-groomed. Answers questions appropriately.  HEENT: [place]/AT, EOMI, MMM, anicteric sclera; Neck: no carotid bruit, no JVD and supple, symmetrical, trachea midline  Lungs: Mostly clear to auscultation with faint end extra wheezing in the mid lung fields. Otherwise nonlabored with good air movement Heart: RRR , S1 normal with split S2, no murmur, click, rub or gallop and normal apical impulse  Abdomen: soft, non-tender; bowel sounds normal; no masses, no organomegaly  Extremities:  extremities normal, atraumatic, no cyanosis or edema  Pulses: 2+ and symmetric  Neurologic: Grossly normal    Adult ECG Report  Rate: 67;  Rhythm: normal sinus rhythm and LBBB.  Narrative Interpretation: Otherwise normal / stable EKG.  Recent Labs:  None available. PCP checks.  recently checked.  ASSESSMENT / PLAN: Problem List Items Addressed This Visit    Palpitations - Primary (Chronic)    Well-controlled on current medications. Only taking one half tablet of metoprolol tartrate twice a day. Okay to take additional dose when necessary. Continue to avoid caffeine.      Relevant Orders   EKG 12-Lead   LBBB (left bundle branch block) (Chronic)    Chronic. No change. No heart failure symptoms.      Relevant Medications   metoprolol tartrate (LOPRESSOR) 25 MG tablet   Other Relevant Orders   EKG 12-Lead   Hypertension, essential, benign (Chronic)    Well-controlled on current medications. She still notes some  orthostatic hypotension. Is taking losartan in the midday.       Relevant Medications   metoprolol tartrate (LOPRESSOR) 25 MG tablet   Other Relevant Orders   EKG 12-Lead   Dyspnea on exertion   Dyslipidemia (Chronic)    Labs followed by PCP. Not on statin      Relevant Orders   EKG 12-Lead    Other Visit Diagnoses   None.     Orders Placed This Encounter  Procedures  . EKG 12-Lead    Order Specific Question:   Where should this test be performed    Answer:   Other   Meds ordered this encounter  Medications  . ADVAIR DISKUS 250-50 MCG/DOSE AEPB  . esomeprazole (NEXIUM) 20 MG capsule    Sig: Take 20 mg by mouth daily at 12 noon.  . metoprolol tartrate (LOPRESSOR) 25 MG tablet    Sig: Take 0.5 tablets (12.5 mg total) by mouth 2 (two) times daily.    Dispense:  90 tablet    Refill:  3    No Medication changes.  Followup: 1 yr   Glenetta Hew, M.D., M.S. Interventional Cardiologist   Pager # [phone]

## 2015-11-18 NOTE — Assessment & Plan Note (Signed)
Labs followed by PCP. Not on statin

## 2015-11-18 NOTE — Assessment & Plan Note (Signed)
Chronic. No change. No heart failure symptoms.

## 2016-01-27 DIAGNOSIS — F411 Generalized anxiety disorder: Secondary | ICD-10-CM | POA: Diagnosis not present

## 2016-01-27 DIAGNOSIS — K219 Gastro-esophageal reflux disease without esophagitis: Secondary | ICD-10-CM | POA: Diagnosis not present

## 2016-01-30 DIAGNOSIS — K219 Gastro-esophageal reflux disease without esophagitis: Secondary | ICD-10-CM | POA: Diagnosis not present

## 2016-01-30 DIAGNOSIS — J4540 Moderate persistent asthma, uncomplicated: Secondary | ICD-10-CM | POA: Diagnosis not present

## 2016-01-30 DIAGNOSIS — I454 Nonspecific intraventricular block: Secondary | ICD-10-CM | POA: Diagnosis not present

## 2016-01-30 DIAGNOSIS — Z6823 Body mass index (BMI) 23.0-23.9, adult: Secondary | ICD-10-CM | POA: Diagnosis not present

## 2016-01-30 DIAGNOSIS — Z23 Encounter for immunization: Secondary | ICD-10-CM | POA: Diagnosis not present

## 2016-06-16 DIAGNOSIS — J019 Acute sinusitis, unspecified: Secondary | ICD-10-CM | POA: Diagnosis not present

## 2016-06-16 DIAGNOSIS — Z6824 Body mass index (BMI) 24.0-24.9, adult: Secondary | ICD-10-CM | POA: Diagnosis not present

## 2016-06-16 DIAGNOSIS — J302 Other seasonal allergic rhinitis: Secondary | ICD-10-CM | POA: Diagnosis not present

## 2016-08-19 ENCOUNTER — Other Ambulatory Visit: Payer: Self-pay | Admitting: Cardiology

## 2016-09-29 ENCOUNTER — Telehealth: Payer: Self-pay | Admitting: Cardiology

## 2016-09-29 NOTE — Telephone Encounter (Signed)
Patient would like to switch from Dr. Ellyn Hack to Dr. Harl Bowie due to the location. [place] would be better.

## 2016-09-29 NOTE — Telephone Encounter (Signed)
Ok with me  Zandra Abts MD

## 2016-10-05 NOTE — Telephone Encounter (Signed)
Fine with me.  Aanvi Voyles, MD  

## 2016-11-16 ENCOUNTER — Other Ambulatory Visit: Payer: Self-pay | Admitting: Cardiology

## 2016-11-16 NOTE — Telephone Encounter (Signed)
REFILL 

## 2016-11-18 ENCOUNTER — Encounter: Payer: Self-pay | Admitting: *Deleted

## 2016-11-18 ENCOUNTER — Telehealth: Payer: Self-pay | Admitting: Cardiology

## 2016-11-18 ENCOUNTER — Encounter: Payer: Self-pay | Admitting: Cardiology

## 2016-11-18 ENCOUNTER — Ambulatory Visit (INDEPENDENT_AMBULATORY_CARE_PROVIDER_SITE_OTHER): Payer: PPO | Admitting: Cardiology

## 2016-11-18 VITALS — BP 122/74 | HR 69 | Ht 68.0 in | Wt 151.0 lb

## 2016-11-18 DIAGNOSIS — R002 Palpitations: Secondary | ICD-10-CM

## 2016-11-18 DIAGNOSIS — R0602 Shortness of breath: Secondary | ICD-10-CM | POA: Diagnosis not present

## 2016-11-18 DIAGNOSIS — I447 Left bundle-branch block, unspecified: Secondary | ICD-10-CM

## 2016-11-18 DIAGNOSIS — Z23 Encounter for immunization: Secondary | ICD-10-CM | POA: Diagnosis not present

## 2016-11-18 DIAGNOSIS — I10 Essential (primary) hypertension: Secondary | ICD-10-CM

## 2016-11-18 NOTE — Telephone Encounter (Signed)
Pre-cert Verification for the following procedure   Echo scheduled for 11-26-16

## 2016-11-18 NOTE — Patient Instructions (Signed)

## 2016-11-18 NOTE — Progress Notes (Signed)
Clinical Summary Jean Rivas is a 66 y.o.female seen today for follow up of the following medical problems.   1. LBBB - chronic for several years. Prior stress testing and echo overall unremarkable for significant cardiac disease  2. Palpitations - noted PACs and PVCs on EKG, medically managed with beta blocker. Has not had to wear a monitor - just occasional symptoms since last visit, has done well on low dose beta blocker.    3. HTN - medical therapy limited due to some prior orthostatic dizziness.    4. SOB - can have some SOB with walking up stairs, mild worsening over the last several months.  - no LE edema. No orthopnea. Occasional chest pain better with belching.    5. Asthma - compliant with inhalers   Past Medical History:  Diagnosis Date  . GERD (gastroesophageal reflux disease)   . Heart palpitations   . LBBB (left bundle Katara Griner block)    Mildly reduced LVEF ~45-50%, Gr 1 DD, Mild MR & TR; 2010 Myoivew read as "mild" aical ischemia - LOW RISK (not acted upon)  . RAD (reactive airway disease)     exacerbated by allergies  . Seasonal allergies      Allergies  Allergen Reactions  . Penicillins Itching     Current Outpatient Prescriptions  Medication Sig Dispense Refill  . ADVAIR DISKUS 250-50 MCG/DOSE AEPB     . albuterol (PROVENTIL HFA;VENTOLIN HFA) 108 (90 BASE) MCG/ACT inhaler Inhale 1-2 puffs into the lungs every 6 (six) hours as needed for wheezing or shortness of breath.    Marland Kitchen aspirin EC 81 MG tablet Take 81 mg by mouth daily.    . cetirizine (ZYRTEC) 10 MG tablet Take 10 mg by mouth daily.    Marland Kitchen esomeprazole (NEXIUM) 20 MG capsule Take 20 mg by mouth daily at 12 noon.    Marland Kitchen losartan (COZAAR) 25 MG tablet Take 1 tablet (25 mg total) by mouth daily. NEED OV. 90 tablet 0  . metoprolol tartrate (LOPRESSOR) 25 MG tablet Take 0.5 tablets (12.5 mg total) by mouth 2 (two) times daily. 90 tablet 3  . ranitidine (ZANTAC) 150 MG tablet Take 150 mg by mouth  daily.      No current facility-administered medications for this visit.      Past Surgical History:  Procedure Laterality Date  . 2D Echocardiogram  08/28/2009   EF 45-50%, impaired (stage1) diastolic dysfunction, mild regurg of the mitral and tricuspid valves  . Lexiscan Myoview  03/26/2008   Mild ischemia in apical regions, post-stress EF 67%, nondiagnostic EKG, low-risk abnormal study     Allergies  Allergen Reactions  . Penicillins Itching      Family History  Problem Relation Age of Onset  . Lung disease Father        Contracted in [place]  . Diabetes Mother      Social History Jean Rivas reports that she has never smoked. She has never used smokeless tobacco. Jean Rivas reports that she drinks alcohol.   Review of Systems CONSTITUTIONAL: No weight loss, fever, chills, weakness or fatigue.  HEENT: Eyes: No visual loss, blurred vision, double vision or yellow sclerae.No hearing loss, sneezing, congestion, runny nose or sore throat.  SKIN: No rash or itching.  CARDIOVASCULAR: per hpi RESPIRATORY: per hpi  GASTROINTESTINAL: No anorexia, nausea, vomiting or diarrhea. No abdominal pain or blood.  GENITOURINARY: No burning on urination, no polyuria NEUROLOGICAL: No headache, dizziness, syncope, paralysis, ataxia, numbness or tingling in the  extremities. No change in bowel or bladder control.  MUSCULOSKELETAL: No muscle, back pain, joint pain or stiffness.  LYMPHATICS: No enlarged nodes. No history of splenectomy.  PSYCHIATRIC: No history of depression or anxiety.  ENDOCRINOLOGIC: No reports of sweating, cold or heat intolerance. No polyuria or polydipsia.  Marland Kitchen   Physical Examination Vitals:   11/18/16 1120  BP: 122/74  Pulse: 69   Vitals:   11/18/16 1120  Weight: 151 lb (68.5 kg)  Height: 5\' 8"  (1.727 m)    Gen: resting comfortably, no acute distress HEENT: no scleral icterus, pupils equal round and reactive, no palptable cervical adenopathy,  CV: RRR, no  m/r/g, no jvd Resp: Clear to auscultation bilaterally GI: abdomen is soft, non-tender, non-distended, normal bowel sounds, no hepatosplenomegaly MSK: extremities are warm, no edema.  Skin: warm, no rash Neuro:  no focal deficits Psych: appropriate affect   Diagnostic Studies  08/2009 echo LVEF 56-81%, grade I diastolic dysfunction, mild MR and TR  03/2008 Lexiscan: mild apical ischemia, low risk.   Assessment and Plan  1. LBBB - chronic, no major underlying structural heart disease by prior testing - continue to monitor  2. Palpitations - controlled, continue beta blocker - EKG in clinic today shows SR.   3. HTN - at goal, continue current meds  4. SOB - mild worsening from her baseline. May be related to her asthmas. She had low normal to mildly decreased LVEF in 2011, given her symptoms we will repeat echo   F/u 1 year. Request labs from pcp      Arnoldo Lenis, M.D., F.A.C.C.

## 2016-11-26 ENCOUNTER — Other Ambulatory Visit: Payer: Self-pay

## 2016-11-26 ENCOUNTER — Ambulatory Visit (INDEPENDENT_AMBULATORY_CARE_PROVIDER_SITE_OTHER): Payer: PPO

## 2016-11-26 DIAGNOSIS — R0602 Shortness of breath: Secondary | ICD-10-CM | POA: Diagnosis not present

## 2016-11-30 ENCOUNTER — Other Ambulatory Visit: Payer: Self-pay | Admitting: Cardiology

## 2016-11-30 MED ORDER — METOPROLOL TARTRATE 25 MG PO TABS: 12.5000 mg | ORAL_TABLET | Freq: Two times a day (BID) | ORAL | 1 refills | Status: DC

## 2016-11-30 NOTE — Telephone Encounter (Signed)
Medication sent to pharmacy  

## 2016-11-30 NOTE — Telephone Encounter (Signed)
metoprolol tartrate (LOPRESSOR) 25 MG tablet   [facility] in [place]

## 2016-12-01 ENCOUNTER — Telehealth: Payer: Self-pay | Admitting: *Deleted

## 2016-12-01 NOTE — Telephone Encounter (Signed)
Pt aware - routed to pcp  

## 2016-12-01 NOTE — Telephone Encounter (Signed)
-----   Message from Herminio Commons, MD sent at 11/26/2016  1:59 PM EDT ----- Normal pumping function.

## 2016-12-11 DIAGNOSIS — E559 Vitamin D deficiency, unspecified: Secondary | ICD-10-CM | POA: Diagnosis not present

## 2016-12-11 DIAGNOSIS — F411 Generalized anxiety disorder: Secondary | ICD-10-CM | POA: Diagnosis not present

## 2016-12-11 DIAGNOSIS — K219 Gastro-esophageal reflux disease without esophagitis: Secondary | ICD-10-CM | POA: Diagnosis not present

## 2016-12-11 DIAGNOSIS — R5383 Other fatigue: Secondary | ICD-10-CM | POA: Diagnosis not present

## 2016-12-16 DIAGNOSIS — J302 Other seasonal allergic rhinitis: Secondary | ICD-10-CM | POA: Diagnosis not present

## 2016-12-16 DIAGNOSIS — Z6824 Body mass index (BMI) 24.0-24.9, adult: Secondary | ICD-10-CM | POA: Diagnosis not present

## 2016-12-16 DIAGNOSIS — I454 Nonspecific intraventricular block: Secondary | ICD-10-CM | POA: Diagnosis not present

## 2016-12-16 DIAGNOSIS — Z1389 Encounter for screening for other disorder: Secondary | ICD-10-CM | POA: Diagnosis not present

## 2016-12-16 DIAGNOSIS — K219 Gastro-esophageal reflux disease without esophagitis: Secondary | ICD-10-CM | POA: Diagnosis not present

## 2016-12-16 DIAGNOSIS — Z0000 Encounter for general adult medical examination without abnormal findings: Secondary | ICD-10-CM | POA: Diagnosis not present

## 2016-12-16 DIAGNOSIS — J4540 Moderate persistent asthma, uncomplicated: Secondary | ICD-10-CM | POA: Diagnosis not present

## 2016-12-16 DIAGNOSIS — F411 Generalized anxiety disorder: Secondary | ICD-10-CM | POA: Diagnosis not present

## 2017-02-15 ENCOUNTER — Other Ambulatory Visit: Payer: Self-pay

## 2017-02-15 MED ORDER — LOSARTAN POTASSIUM 25 MG PO TABS: 25.0000 mg | ORAL_TABLET | Freq: Every day | ORAL | 2 refills | Status: DC

## 2017-05-22 ENCOUNTER — Other Ambulatory Visit: Payer: Self-pay | Admitting: Cardiovascular Disease

## 2017-06-14 DIAGNOSIS — K219 Gastro-esophageal reflux disease without esophagitis: Secondary | ICD-10-CM | POA: Diagnosis not present

## 2017-06-14 DIAGNOSIS — R5383 Other fatigue: Secondary | ICD-10-CM | POA: Diagnosis not present

## 2017-06-14 DIAGNOSIS — F411 Generalized anxiety disorder: Secondary | ICD-10-CM | POA: Diagnosis not present

## 2017-06-14 DIAGNOSIS — J4540 Moderate persistent asthma, uncomplicated: Secondary | ICD-10-CM | POA: Diagnosis not present

## 2017-06-17 DIAGNOSIS — Z6824 Body mass index (BMI) 24.0-24.9, adult: Secondary | ICD-10-CM | POA: Diagnosis not present

## 2017-06-17 DIAGNOSIS — K219 Gastro-esophageal reflux disease without esophagitis: Secondary | ICD-10-CM | POA: Diagnosis not present

## 2017-06-17 DIAGNOSIS — F411 Generalized anxiety disorder: Secondary | ICD-10-CM | POA: Diagnosis not present

## 2017-06-17 DIAGNOSIS — J302 Other seasonal allergic rhinitis: Secondary | ICD-10-CM | POA: Diagnosis not present

## 2017-06-17 DIAGNOSIS — I454 Nonspecific intraventricular block: Secondary | ICD-10-CM | POA: Diagnosis not present

## 2017-06-17 DIAGNOSIS — J4540 Moderate persistent asthma, uncomplicated: Secondary | ICD-10-CM | POA: Diagnosis not present

## 2017-08-11 DIAGNOSIS — K210 Gastro-esophageal reflux disease with esophagitis: Secondary | ICD-10-CM | POA: Diagnosis not present

## 2017-08-11 DIAGNOSIS — I493 Ventricular premature depolarization: Secondary | ICD-10-CM | POA: Diagnosis not present

## 2017-08-11 DIAGNOSIS — J301 Allergic rhinitis due to pollen: Secondary | ICD-10-CM | POA: Diagnosis not present

## 2017-08-11 DIAGNOSIS — J4540 Moderate persistent asthma, uncomplicated: Secondary | ICD-10-CM | POA: Diagnosis not present

## 2017-11-15 ENCOUNTER — Other Ambulatory Visit: Payer: Self-pay | Admitting: Cardiology

## 2017-12-21 DIAGNOSIS — R5383 Other fatigue: Secondary | ICD-10-CM | POA: Diagnosis not present

## 2017-12-21 DIAGNOSIS — K219 Gastro-esophageal reflux disease without esophagitis: Secondary | ICD-10-CM | POA: Diagnosis not present

## 2017-12-21 DIAGNOSIS — E559 Vitamin D deficiency, unspecified: Secondary | ICD-10-CM | POA: Diagnosis not present

## 2017-12-24 DIAGNOSIS — J302 Other seasonal allergic rhinitis: Secondary | ICD-10-CM | POA: Diagnosis not present

## 2017-12-24 DIAGNOSIS — J4540 Moderate persistent asthma, uncomplicated: Secondary | ICD-10-CM | POA: Diagnosis not present

## 2017-12-24 DIAGNOSIS — Z23 Encounter for immunization: Secondary | ICD-10-CM | POA: Diagnosis not present

## 2017-12-24 DIAGNOSIS — Z0001 Encounter for general adult medical examination with abnormal findings: Secondary | ICD-10-CM | POA: Diagnosis not present

## 2017-12-24 DIAGNOSIS — K219 Gastro-esophageal reflux disease without esophagitis: Secondary | ICD-10-CM | POA: Diagnosis not present

## 2017-12-24 DIAGNOSIS — Z6825 Body mass index (BMI) 25.0-25.9, adult: Secondary | ICD-10-CM | POA: Diagnosis not present

## 2017-12-24 DIAGNOSIS — E559 Vitamin D deficiency, unspecified: Secondary | ICD-10-CM | POA: Diagnosis not present

## 2017-12-24 DIAGNOSIS — F411 Generalized anxiety disorder: Secondary | ICD-10-CM | POA: Diagnosis not present

## 2018-01-03 DIAGNOSIS — Z1212 Encounter for screening for malignant neoplasm of rectum: Secondary | ICD-10-CM | POA: Diagnosis not present

## 2018-01-03 DIAGNOSIS — Z1211 Encounter for screening for malignant neoplasm of colon: Secondary | ICD-10-CM | POA: Diagnosis not present

## 2018-01-14 LAB — COLOGUARD

## 2018-01-24 ENCOUNTER — Ambulatory Visit (INDEPENDENT_AMBULATORY_CARE_PROVIDER_SITE_OTHER): Payer: PPO | Admitting: Cardiology

## 2018-01-24 ENCOUNTER — Encounter: Payer: Self-pay | Admitting: Cardiology

## 2018-01-24 VITALS — BP 120/78 | HR 88 | Ht 68.0 in | Wt 155.2 lb

## 2018-01-24 DIAGNOSIS — I10 Essential (primary) hypertension: Secondary | ICD-10-CM

## 2018-01-24 DIAGNOSIS — R002 Palpitations: Secondary | ICD-10-CM

## 2018-01-24 DIAGNOSIS — I447 Left bundle-branch block, unspecified: Secondary | ICD-10-CM

## 2018-01-24 MED ORDER — METOPROLOL TARTRATE 25 MG PO TABS: ORAL_TABLET | ORAL | 3 refills | Status: DC

## 2018-01-24 MED ORDER — LOSARTAN POTASSIUM 25 MG PO TABS: 25.0000 mg | ORAL_TABLET | Freq: Every day | ORAL | 3 refills | Status: DC

## 2018-01-24 NOTE — Progress Notes (Signed)
Clinical Summary Ms. Bihm is a 67 y.o.female seen today for follow up of the following medical problems.   1. LBBB - chronic for several years. Prior stress testing and echo overall unremarkable for significant cardiac disease - no new symptoms.    2. Palpitations - noted PACs and PVCs on EKG, medically managed with beta blocker. Has not had to wear a monitor  - no recent palpitations. Doing well on low dose beta blocker.    3. HTN - medical therapy limited due to some prior orthostatic dizziness.  - compliant with meds   4. SOB  11/2016 echo LVEF 50-55%, no WMAs, grade I diastolic dysfunction - breathing has improved since starting spiriva  5. Reactive airway disease - compliant with inhalers - breathing improved with spiriva - followed by Dr Luan Pulling.    Past Medical History:  Diagnosis Date  . GERD (gastroesophageal reflux disease)   . Heart palpitations   . LBBB (left bundle [place] block)    Mildly reduced LVEF ~45-50%, Gr 1 DD, Mild MR & TR; 2010 Myoivew read as "mild" aical ischemia - LOW RISK (not acted upon)  . RAD (reactive airway disease)     exacerbated by allergies  . Seasonal allergies      Allergies  Allergen Reactions  . Penicillins Itching     Current Outpatient Medications  Medication Sig Dispense Refill  . ADVAIR DISKUS 250-50 MCG/DOSE AEPB     . albuterol (PROVENTIL HFA;VENTOLIN HFA) 108 (90 BASE) MCG/ACT inhaler Inhale 1-2 puffs into the lungs every 6 (six) hours as needed for wheezing or shortness of breath.    Marland Kitchen aspirin EC 81 MG tablet Take 81 mg by mouth daily.    . cetirizine (ZYRTEC) 10 MG tablet Take 10 mg by mouth daily.    Marland Kitchen esomeprazole (NEXIUM) 20 MG capsule Take 20 mg by mouth daily at 12 noon.    Marland Kitchen losartan (COZAAR) 25 MG tablet Take 1 tablet (25 mg total) by mouth daily. NEED OV. 90 tablet 2  . metoprolol tartrate (LOPRESSOR) 25 MG tablet TAKE 1/2 A TABLET (12.5 MG TOTAL) BY MOUTH 2 (TWO) TIMES DAILY. 90 tablet 0    . ranitidine (ZANTAC) 150 MG tablet Take 150 mg by mouth daily.      No current facility-administered medications for this visit.      Past Surgical History:  Procedure Laterality Date  . 2D Echocardiogram  08/28/2009   EF 45-50%, impaired (stage1) diastolic dysfunction, mild regurg of the mitral and tricuspid valves  . Lexiscan Myoview  03/26/2008   Mild ischemia in apical regions, post-stress EF 67%, nondiagnostic EKG, low-risk abnormal study     Allergies  Allergen Reactions  . Penicillins Itching      Family History  Problem Relation Age of Onset  . Lung disease Father        Contracted in [place]  . Diabetes Mother      Social History Ms. Marsan reports that she has never smoked. She has never used smokeless tobacco. Ms. Brokaw reports that she drinks alcohol.   Review of Systems CONSTITUTIONAL: No weight loss, fever, chills, weakness or fatigue.  HEENT: Eyes: No visual loss, blurred vision, double vision or yellow sclerae.No hearing loss, sneezing, congestion, runny nose or sore throat.  SKIN: No rash or itching.  CARDIOVASCULAR: per hpi RESPIRATORY: No shortness of breath, cough or sputum.  GASTROINTESTINAL: No anorexia, nausea, vomiting or diarrhea. No abdominal pain or blood.  GENITOURINARY: No burning on urination,  no polyuria NEUROLOGICAL: No headache, dizziness, syncope, paralysis, ataxia, numbness or tingling in the extremities. No change in bowel or bladder control.  MUSCULOSKELETAL: No muscle, back pain, joint pain or stiffness.  LYMPHATICS: No enlarged nodes. No history of splenectomy.  PSYCHIATRIC: No history of depression or anxiety.  ENDOCRINOLOGIC: No reports of sweating, cold or heat intolerance. No polyuria or polydipsia.  Marland Kitchen   Physical Examination Vitals:   01/24/18 1440  BP: 120/78  Pulse: 88  SpO2: 99%   Vitals:   01/24/18 1440  Weight: 155 lb 3.2 oz (70.4 kg)  Height: 5\' 8"  (1.727 m)    Gen: resting comfortably, no acute  distress HEENT: no scleral icterus, pupils equal round and reactive, no palptable cervical adenopathy,  CV: RRR, no m/r/g, no jvd Resp: Clear to auscultation bilaterally GI: abdomen is soft, non-tender, non-distended, normal bowel sounds, no hepatosplenomegaly MSK: extremities are warm, no edema.  Skin: warm, no rash Neuro:  no focal deficits Psych: appropriate affect   Diagnostic Studies 08/2009 echo LVEF 93-73%, grade I diastolic dysfunction, mild MR and TR  03/2008 Lexiscan: mild apical ischemia, low risk.   11/2016 echo Study Conclusions  - Left ventricle: The cavity size was normal. Wall thickness was   normal. Systolic function was normal. The estimated ejection   fraction was in the range of 50% to 55%. Wall motion was normal;   there were no regional wall motion abnormalities. Doppler   parameters are consistent with abnormal left ventricular   relaxation (grade 1 diastolic dysfunction). - Aortic valve: Mildly calcified annulus. Trileaflet. - Mitral valve: There was trivial regurgitation. - Right atrium: Central venous pressure (est): 3 mm Hg. - Atrial septum: No defect or patent foramen ovale was identified. - Tricuspid valve: There was trivial regurgitation. - Pulmonary arteries: PA peak pressure: 11 mm Hg (S). - Pericardium, extracardiac: A prominent pericardial fat pad was   present.  Impressions:  - Normal LV wall thickness with LVEF 50-55% and grade 1 diastolic   dysfunction. Mildly calcified aortic annulus. Trivial tricuspid   regurgitation.    Assessment and Plan  1. LBBB - chronic, no major underlying structural heart disease by prior testing - we will continue to monitor at this time  2. Palpitations - no recent symptoms. EKG shows SR with LBBB, continue beta blocker.   3. HTN - she is at goal, continue current meds     F/u 1 year. Request labs from pcp      Arnoldo Lenis, M.D.

## 2018-01-24 NOTE — Patient Instructions (Signed)

## 2018-01-25 ENCOUNTER — Encounter: Payer: Self-pay | Admitting: *Deleted

## 2018-02-11 ENCOUNTER — Encounter (INDEPENDENT_AMBULATORY_CARE_PROVIDER_SITE_OTHER): Payer: Self-pay | Admitting: Internal Medicine

## 2018-02-11 ENCOUNTER — Ambulatory Visit (INDEPENDENT_AMBULATORY_CARE_PROVIDER_SITE_OTHER): Payer: PPO | Admitting: Internal Medicine

## 2018-02-11 VITALS — BP 144/82 | HR 80 | Temp 98.2°F | Ht 68.0 in | Wt 155.3 lb

## 2018-02-11 DIAGNOSIS — R195 Other fecal abnormalities: Secondary | ICD-10-CM | POA: Diagnosis not present

## 2018-02-11 DIAGNOSIS — K219 Gastro-esophageal reflux disease without esophagitis: Secondary | ICD-10-CM

## 2018-02-11 MED ORDER — PANTOPRAZOLE SODIUM 40 MG PO TBEC: 40.0000 mg | DELAYED_RELEASE_TABLET | Freq: Every day | ORAL | 3 refills | Status: DC

## 2018-02-11 NOTE — Addendum Note (Signed)
Addended by: Butch Penny on: 02/11/2018 10:35 AM   Modules accepted: Orders, SmartSet

## 2018-02-11 NOTE — Progress Notes (Signed)
   Subjective:    Patient ID: Jean Rivas, female    DOB: [birth year], 67 y.o.   MRN: [id number]  HPI Referred by Dr. Pleas Koch for positive cologuard. No changes in her stool. She says she had IBS when her husband had heart surgery.in August of this year.  She says now she is having normal bowel movement.  Her appetite is good. No weight loss. No family hx of colon cancer. She has never had a colonoscopy. She says her GERD in not controlled with Nexium.    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  Review of Systems Past Medical History:  Diagnosis Date  . GERD (gastroesophageal reflux disease)   . Heart palpitations   . LBBB (left bundle branch block)    Mildly reduced LVEF ~45-50%, Gr 1 DD, Mild MR & TR; 2010 Myoivew read as "mild" aical ischemia - LOW RISK (not acted upon)  . RAD (reactive airway disease)     exacerbated by allergies  . Seasonal allergies     Past Surgical History:  Procedure Laterality Date  . 2D Echocardiogram  08/28/2009   EF 45-50%, impaired (stage1) diastolic dysfunction, mild regurg of the mitral and tricuspid valves  . Lexiscan Myoview  03/26/2008   Mild ischemia in apical regions, post-stress EF 67%, nondiagnostic EKG, low-risk abnormal study    Allergies  Allergen Reactions  . Penicillins Itching    Current Outpatient Medications on File Prior to Visit  Medication Sig Dispense Refill  . ADVAIR DISKUS 250-50 MCG/DOSE AEPB Inhale 1 puff into the lungs daily.     Marland Kitchen albuterol (PROVENTIL HFA;VENTOLIN HFA) 108 (90 BASE) MCG/ACT inhaler Inhale 1-2 puffs into the lungs every 6 (six) hours as needed for wheezing or shortness of breath.    Marland Kitchen aspirin EC 81 MG tablet Take 81 mg by mouth daily.    . cetirizine (ZYRTEC) 10 MG tablet Take 10 mg by mouth daily.    Marland Kitchen esomeprazole (NEXIUM) 20 MG capsule Take 20 mg by mouth daily at 12 noon.    Marland Kitchen losartan (COZAAR) 25 MG tablet Take 1 tablet (25 mg total) by mouth daily. 90 tablet 3  . metoprolol  tartrate (LOPRESSOR) 25 MG tablet TAKE 1/2 A TABLET (12.5 MG TOTAL) BY MOUTH 2 (TWO) TIMES DAILY. 90 tablet 3  . tiotropium (SPIRIVA HANDIHALER) 18 MCG inhalation capsule Place 1 capsule into inhaler and inhale daily.     No current facility-administered medications on file prior to visit.         Objective:   Physical Exam Blood pressure (!) 144/82, pulse 80, temperature 98.2 F (36.8 C), height 5\' 8"  (1.727 m), weight 155 lb 4.8 oz (70.4 kg). Alert and oriented. Skin warm and dry. Oral mucosa is moist.   . Sclera anicteric, conjunctivae is pink. Thyroid not enlarged. No cervical lymphadenopathy. Lungs clear. Heart regular rate and rhythm.  Abdomen is soft. Bowel sounds are positive. No hepatomegaly. No abdominal masses felt. No tenderness.  No edema to lower extremities.           Assessment & Plan:  Positive cologuard. Colonoscopy. The risks of bleeding, perforation and infection were reviewed with patient. Hand out of colonoscopy give to patient. GERD: Rx for Protonix sent to her pharmacy. Samples of Dexilant x 6 boxes given to patient.

## 2018-02-11 NOTE — Patient Instructions (Signed)
The risks of bleeding, perforation and infection were reviewed with patient.  

## 2018-02-14 ENCOUNTER — Telehealth (INDEPENDENT_AMBULATORY_CARE_PROVIDER_SITE_OTHER): Payer: Self-pay | Admitting: *Deleted

## 2018-02-14 ENCOUNTER — Encounter (INDEPENDENT_AMBULATORY_CARE_PROVIDER_SITE_OTHER): Payer: Self-pay | Admitting: *Deleted

## 2018-02-14 DIAGNOSIS — I10 Essential (primary) hypertension: Secondary | ICD-10-CM | POA: Diagnosis not present

## 2018-02-14 DIAGNOSIS — J4550 Severe persistent asthma, uncomplicated: Secondary | ICD-10-CM | POA: Diagnosis not present

## 2018-02-14 DIAGNOSIS — R195 Other fecal abnormalities: Secondary | ICD-10-CM | POA: Insufficient documentation

## 2018-02-14 MED ORDER — PEG 3350-KCL-NA BICARB-NACL 420 G PO SOLR: 4000.0000 mL | Freq: Once | ORAL | 0 refills | Status: AC

## 2018-02-14 NOTE — Telephone Encounter (Signed)
Patient needs trilyte 

## 2018-03-22 ENCOUNTER — Encounter (INDEPENDENT_AMBULATORY_CARE_PROVIDER_SITE_OTHER): Payer: Self-pay

## 2018-03-23 ENCOUNTER — Other Ambulatory Visit: Payer: Self-pay

## 2018-03-23 ENCOUNTER — Encounter (HOSPITAL_COMMUNITY)
Admission: RE | Disposition: A | Discharge status: Home / Self Care | Payer: Self-pay | Source: Home / Self Care | Attending: Internal Medicine

## 2018-03-23 ENCOUNTER — Ambulatory Visit (HOSPITAL_COMMUNITY)
Admission: RE | Admit: 2018-03-23 | Discharge: 2018-03-23 | Disposition: A | Discharge status: Home / Self Care | Payer: PPO | Attending: Internal Medicine | Admitting: Internal Medicine

## 2018-03-23 ENCOUNTER — Encounter (HOSPITAL_COMMUNITY): Payer: Self-pay | Admitting: *Deleted

## 2018-03-23 DIAGNOSIS — K5730 Diverticulosis of large intestine without perforation or abscess without bleeding: Secondary | ICD-10-CM | POA: Diagnosis not present

## 2018-03-23 DIAGNOSIS — Z7982 Long term (current) use of aspirin: Secondary | ICD-10-CM | POA: Diagnosis not present

## 2018-03-23 DIAGNOSIS — Z79899 Other long term (current) drug therapy: Secondary | ICD-10-CM | POA: Diagnosis not present

## 2018-03-23 DIAGNOSIS — J45909 Unspecified asthma, uncomplicated: Secondary | ICD-10-CM | POA: Insufficient documentation

## 2018-03-23 DIAGNOSIS — K589 Irritable bowel syndrome without diarrhea: Secondary | ICD-10-CM | POA: Insufficient documentation

## 2018-03-23 DIAGNOSIS — Z7951 Long term (current) use of inhaled steroids: Secondary | ICD-10-CM | POA: Insufficient documentation

## 2018-03-23 DIAGNOSIS — K219 Gastro-esophageal reflux disease without esophagitis: Secondary | ICD-10-CM | POA: Diagnosis not present

## 2018-03-23 DIAGNOSIS — I447 Left bundle-branch block, unspecified: Secondary | ICD-10-CM | POA: Diagnosis not present

## 2018-03-23 DIAGNOSIS — K635 Polyp of colon: Secondary | ICD-10-CM | POA: Diagnosis not present

## 2018-03-23 DIAGNOSIS — D123 Benign neoplasm of transverse colon: Secondary | ICD-10-CM | POA: Diagnosis not present

## 2018-03-23 DIAGNOSIS — R195 Other fecal abnormalities: Secondary | ICD-10-CM | POA: Diagnosis not present

## 2018-03-23 DIAGNOSIS — K644 Residual hemorrhoidal skin tags: Secondary | ICD-10-CM | POA: Insufficient documentation

## 2018-03-23 DIAGNOSIS — F419 Anxiety disorder, unspecified: Secondary | ICD-10-CM | POA: Insufficient documentation

## 2018-03-23 HISTORY — PX: COLONOSCOPY: SHX5424

## 2018-03-23 HISTORY — PX: BIOPSY: SHX5522

## 2018-03-23 HISTORY — DX: Unspecified asthma, uncomplicated: J45.909

## 2018-03-23 HISTORY — DX: Anxiety disorder, unspecified: F41.9

## 2018-03-23 SURGERY — COLONOSCOPY
Anesthesia: Moderate Sedation

## 2018-03-23 MED ORDER — MEPERIDINE HCL 50 MG/ML IJ SOLN
INTRAMUSCULAR | Status: AC
  Filled 2018-03-23: qty 1

## 2018-03-23 MED ORDER — SODIUM CHLORIDE 0.9 % IV SOLN
INTRAVENOUS | Status: DC
  Administered 2018-03-23: 1000 mL via INTRAVENOUS

## 2018-03-23 MED ORDER — MIDAZOLAM HCL 5 MG/5ML IJ SOLN
INTRAMUSCULAR | Status: DC | PRN
  Administered 2018-03-23 (×2): 1 mg via INTRAVENOUS
  Administered 2018-03-23: 2 mg via INTRAVENOUS
  Administered 2018-03-23: 1 mg via INTRAVENOUS
  Administered 2018-03-23: 2 mg via INTRAVENOUS

## 2018-03-23 MED ORDER — MIDAZOLAM HCL 5 MG/5ML IJ SOLN
INTRAMUSCULAR | Status: AC
  Filled 2018-03-23: qty 10

## 2018-03-23 MED ORDER — MEPERIDINE HCL 50 MG/ML IJ SOLN
INTRAMUSCULAR | Status: DC | PRN
  Administered 2018-03-23 (×2): 25 mg

## 2018-03-23 NOTE — H&P (Signed)
Jean Rivas is an 68 y.o. female.   Chief Complaint: Patient is here for colonoscopy. HPI: Patient is 68 year old Caucasian female who had Cologuard is testing as a screening tool and came back positive.  She has a history of IBS for several years and has had intermittent diarrhea but she has not experienced rectal bleeding melena or abdominal pain.  She has good appetite and her weight has been stable.  She has never undergone  colonoscopy in the past. Last aspirin dose was 3 days ago. Family history is negative for CRC.  Past Medical History:  Diagnosis Date  . Anxiety   . Asthma   . GERD (gastroesophageal reflux disease)   . Heart palpitations   . LBBB (left bundle branch block)    Mildly reduced LVEF ~45-50%, Gr 1 DD, Mild MR & TR; 2010 Myoivew read as "mild" aical ischemia - LOW RISK (not acted upon)  . RAD (reactive airway disease)     exacerbated by allergies  . Seasonal allergies     Past Surgical History:  Procedure Laterality Date  . 2D Echocardiogram  08/28/2009   EF 45-50%, impaired (stage1) diastolic dysfunction, mild regurg of the mitral and tricuspid valves  . EYE SURGERY Bilateral    cataract  . Lexiscan Myoview  03/26/2008   Mild ischemia in apical regions, post-stress EF 67%, nondiagnostic EKG, low-risk abnormal study    Family History  Problem Relation Age of Onset  . Lung disease Father        Contracted in [place]  . Diabetes Mother    Social History:  reports that she has never smoked. She has never used smokeless tobacco. She reports current alcohol use. She reports that she does not use drugs.  Allergies:  Allergies  Allergen Reactions  . Penicillins Itching    Did it involve swelling of the face/tongue/throat, SOB, or low BP? No Did it involve sudden or severe rash/hives, skin peeling, or any reaction on the inside of your mouth or nose? No Did you need to seek medical attention at a hospital or doctor's office? No When did it last happen?10+  years If all above answers are "NO", may proceed with cephalosporin use.   . Pantoprazole     Urinary retention issues     Medications Prior to Admission  Medication Sig Dispense Refill  . ADVAIR DISKUS 250-50 MCG/DOSE AEPB Inhale 1 puff into the lungs every evening.     Marland Kitchen aspirin EC 81 MG tablet Take 81 mg by mouth daily.    . cetirizine (ZYRTEC) 10 MG tablet Take 10 mg by mouth daily.    . diphenhydrAMINE (BENADRYL) 25 MG tablet Take 25 mg by mouth at bedtime as needed for allergies.    Marland Kitchen esomeprazole (NEXIUM) 20 MG capsule Take 20 mg by mouth every evening.     Marland Kitchen losartan (COZAAR) 25 MG tablet Take 1 tablet (25 mg total) by mouth daily. (Patient taking differently: Take 25 mg by mouth every evening. ) 90 tablet 3  . metoprolol tartrate (LOPRESSOR) 25 MG tablet TAKE 1/2 A TABLET (12.5 MG TOTAL) BY MOUTH 2 (TWO) TIMES DAILY. 90 tablet 3  . tiotropium (SPIRIVA HANDIHALER) 18 MCG inhalation capsule Place 1 capsule into inhaler and inhale daily.    Marland Kitchen triamcinolone cream (KENALOG) 0.1 % Apply 1 application topically daily as needed (eczema).    Marland Kitchen albuterol (PROVENTIL HFA;VENTOLIN HFA) 108 (90 BASE) MCG/ACT inhaler Inhale 1-2 puffs into the lungs every 6 (six) hours as needed for  wheezing or shortness of breath.    . fluticasone (FLONASE) 50 MCG/ACT nasal spray Place 1 spray into both nostrils daily as needed for allergies or rhinitis.    . pantoprazole (PROTONIX) 40 MG tablet Take 1 tablet (40 mg total) by mouth daily. (Patient not taking: Reported on 03/11/2018) 90 tablet 3    No results found for this or any previous visit (from the past 48 hour(s)). No results found.  ROS  Blood pressure (!) 145/72, pulse 95, temperature 97.9 F (36.6 C), temperature source Oral, resp. rate 12, height 5\' 8"  (1.727 m), weight 68 kg, SpO2 100 %. Physical Exam  Constitutional: She appears well-developed and well-nourished.  HENT:  Mouth/Throat: Oropharynx is clear and moist.  Eyes: Conjunctivae are  normal. No scleral icterus.  Neck: No thyromegaly present.  Cardiovascular: Normal rate, regular rhythm and normal heart sounds.  No murmur heard. Respiratory: Effort normal and breath sounds normal.  GI:  Abdomen is symmetrical soft and nontender with organomegaly or masses.  She has a Band-Aid over anterior iliac spine on the right site.  Musculoskeletal:        General: No edema.  Lymphadenopathy:    She has no cervical adenopathy.  Neurological: She is alert.  Skin: Skin is warm and dry.     Assessment/Plan Positive Cologuard test. Diagnostic colonoscopy.  Hildred Laser, MD 03/23/2018, 1:17 PM

## 2018-03-23 NOTE — Op Note (Signed)
[facility] Patient Name: Jean Rivas Procedure Date: 03/23/2018 12:13 PM MRN: [id number] Date of Birth: 04/04/1950 Attending MD: Hildred Laser , MD CSN: [id number] Age: 68 Admit Type: Outpatient Procedure:                Colonoscopy Indications:              Positive Cologuard test Providers:                Hildred Laser, MD, Janeece Riggers, RN, Aram Candela Referring MD:             Curlene Labrum, MD Medicines:                Meperidine 50 mg IV, Midazolam 7 mg IV Complications:            No immediate complications. Estimated Blood Loss:     Estimated blood loss was minimal. Procedure:                Pre-Anesthesia Assessment:                           - Prior to the procedure, a History and Physical                            was performed, and patient medications and                            allergies were reviewed. The patient's tolerance of                            previous anesthesia was also reviewed. The risks                            and benefits of the procedure and the sedation                            options and risks were discussed with the patient.                            All questions were answered, and informed consent                            was obtained. Prior Anticoagulants: The patient                            last took aspirin 3 days prior to the procedure.                            ASA Grade Assessment: II - A patient with mild                            systemic disease. After reviewing the risks and                            benefits, the patient was deemed in satisfactory  condition to undergo the procedure.                           After obtaining informed consent, the colonoscope                            was passed under direct vision. Throughout the                            procedure, the patient's blood pressure, pulse, and                            oxygen saturations were monitored continuously.  The                            [id number]-[id number] ([id number]) scope was introduced through                            the anus and advanced to the the cecum, identified                            by appendiceal orifice and ileocecal valve. The                            colonoscopy was performed without difficulty. The                            patient tolerated the procedure well. The quality                            of the bowel preparation was excellent. The                            ileocecal valve, appendiceal orifice, and rectum                            were photographed. Scope In: 1:28:14 PM Scope Out: 1:57:08 PM Scope Withdrawal Time: 0 hours 16 minutes 41 seconds  Total Procedure Duration: 0 hours 28 minutes 54 seconds  Findings:      The perianal and digital rectal examinations were normal.      A small polyp was found in the splenic flexure. The polyp was sessile.       Biopsies were taken with a cold forceps for histology.      The exam was otherwise normal throughout the examined colon.      A single small-mouthed diverticulum was found in the mid sigmoid colon.      External hemorrhoids were found during retroflexion. The hemorrhoids       were small. Impression:               - One small polyp at the splenic flexure. Biopsied.                           - Diverticulosis in the mid sigmoid colon.                           -  External hemorrhoids. Moderate Sedation:      Moderate (conscious) sedation was administered by the endoscopy nurse       and supervised by the endoscopist. The following parameters were       monitored: oxygen saturation, heart rate, blood pressure, CO2       capnography and response to care. Total physician intraservice time was       36 minutes. Recommendation:           - Patient has a contact number available for                            emergencies. The signs and symptoms of potential                            delayed complications were discussed  with the                            patient. Return to normal activities tomorrow.                            Written discharge instructions were provided to the                            patient.                           - Resume previous diet today.                           - Continue present medications.                           - No aspirin, ibuprofen, naproxen, or other                            non-steroidal anti-inflammatory drugs for 1 day.                           - Await pathology results.                           - Repeat colonoscopy is recommended. The                            colonoscopy date will be determined after pathology                            results from today's exam become available for                            review. Procedure Code(s):        --- Professional ---                           [phone], Colonoscopy, flexible; with biopsy, single  or multiple                           99153, Moderate sedation; each additional 15                            minutes intraservice time                           G0500, Moderate sedation services provided by the                            same physician or other qualified health care                            professional performing a gastrointestinal                            endoscopic service that sedation supports,                            requiring the presence of an independent trained                            observer to assist in the monitoring of the                            patient's level of consciousness and physiological                            status; initial 15 minutes of intra-service time;                            patient age 39 years or older (additional time may                            be reported with [phone], as appropriate) Diagnosis Code(s):        --- Professional ---                           D12.3, Benign neoplasm of transverse colon (hepatic                             flexure or splenic flexure)                           K64.4, Residual hemorrhoidal skin tags                           R19.5, Other fecal abnormalities                           K57.30, Diverticulosis of large intestine without                            perforation or abscess without  bleeding CPT copyright 2018 American Medical Association. All rights reserved. The codes documented in this report are preliminary and upon coder review may  be revised to meet current compliance requirements. Hildred Laser, MD Hildred Laser, MD 03/23/2018 2:07:37 PM This report has been signed electronically. Number of Addenda: 0

## 2018-03-23 NOTE — Discharge Instructions (Signed)
No Aspirin or NSAIDs for 24 hours. Resume other medications and diet as before. No driving for 24 hours. Physician will call with biopsy results.   Colonoscopy, Adult, Care After This sheet gives you information about how to care for yourself after your procedure. Your health care provider may also give you more specific instructions. If you have problems or questions, contact your health care provider. Dr. Laural Golden: [phone].  After hours and weekends call the hospital and have the GI doctor on call paged; they will call you back. What can I expect after the procedure? After the procedure, it is common to have:  A small amount of blood in your stool for 24 hours after the procedure.  Some gas.  Mild abdominal cramping or bloating. Follow these instructions at home: General instructions  For the first 24 hours after the procedure: ? Do not drive or use machinery. ? Do not sign important documents. ? Do not drink alcohol. ? Do your regular daily activities at a slower pace than normal.  Take over-the-counter or prescription medicines only as told by your health care provider. Relieving cramping and bloating   Try walking around when you have cramps or feel bloated. Eating and drinking   Drink enough fluid to keep your urine pale yellow.  Resume your normal diet as instructed by your health care provider. Contact a health care provider if:  You have blood in your stool 2-3 days after the procedure. Get help right away if:  You have more than a small spotting of blood in your stool.  You pass large blood clots in your stool.  Your abdomen is swollen.  You have nausea or vomiting.  You have a fever.  You have increasing abdominal pain that is not relieved with medicine. Summary  After the procedure, it is common to have a small amount of blood in your stool. You may also have mild abdominal cramping and bloating.  For the first 24 hours after the procedure, do  not drive or use machinery, sign important documents, or drink alcohol.  Contact your health care provider if you have a lot of blood in your stool, nausea or vomiting, a fever, or increased abdominal pain. This information is not intended to replace advice given to you by your health care provider. Make sure you discuss any questions you have with your health care provider. Document Released: 09/17/2003 Document Revised: 11/25/2016 Document Reviewed: 04/16/2015 Elsevier Interactive Patient Education  2019 [employer].

## 2018-03-30 ENCOUNTER — Encounter (HOSPITAL_COMMUNITY): Payer: Self-pay | Admitting: Internal Medicine

## 2018-06-17 DIAGNOSIS — K219 Gastro-esophageal reflux disease without esophagitis: Secondary | ICD-10-CM | POA: Diagnosis not present

## 2018-06-17 DIAGNOSIS — F411 Generalized anxiety disorder: Secondary | ICD-10-CM | POA: Diagnosis not present

## 2018-06-17 DIAGNOSIS — E559 Vitamin D deficiency, unspecified: Secondary | ICD-10-CM | POA: Diagnosis not present

## 2018-06-21 DIAGNOSIS — J4540 Moderate persistent asthma, uncomplicated: Secondary | ICD-10-CM | POA: Diagnosis not present

## 2018-06-21 DIAGNOSIS — J302 Other seasonal allergic rhinitis: Secondary | ICD-10-CM | POA: Diagnosis not present

## 2018-06-21 DIAGNOSIS — F411 Generalized anxiety disorder: Secondary | ICD-10-CM | POA: Diagnosis not present

## 2018-06-21 DIAGNOSIS — E559 Vitamin D deficiency, unspecified: Secondary | ICD-10-CM | POA: Diagnosis not present

## 2018-06-21 DIAGNOSIS — K5730 Diverticulosis of large intestine without perforation or abscess without bleeding: Secondary | ICD-10-CM | POA: Diagnosis not present

## 2018-06-21 DIAGNOSIS — K644 Residual hemorrhoidal skin tags: Secondary | ICD-10-CM | POA: Diagnosis not present

## 2018-06-21 DIAGNOSIS — K635 Polyp of colon: Secondary | ICD-10-CM | POA: Diagnosis not present

## 2018-06-21 DIAGNOSIS — K219 Gastro-esophageal reflux disease without esophagitis: Secondary | ICD-10-CM | POA: Diagnosis not present

## 2018-07-21 DIAGNOSIS — C44319 Basal cell carcinoma of skin of other parts of face: Secondary | ICD-10-CM | POA: Diagnosis not present

## 2018-07-28 ENCOUNTER — Other Ambulatory Visit: Payer: Self-pay | Admitting: Cardiology

## 2018-08-25 DIAGNOSIS — C44319 Basal cell carcinoma of skin of other parts of face: Secondary | ICD-10-CM | POA: Diagnosis not present

## 2018-09-22 DIAGNOSIS — C44319 Basal cell carcinoma of skin of other parts of face: Secondary | ICD-10-CM | POA: Diagnosis not present

## 2018-12-16 DIAGNOSIS — F411 Generalized anxiety disorder: Secondary | ICD-10-CM | POA: Diagnosis not present

## 2018-12-16 DIAGNOSIS — K219 Gastro-esophageal reflux disease without esophagitis: Secondary | ICD-10-CM | POA: Diagnosis not present

## 2018-12-26 DIAGNOSIS — J302 Other seasonal allergic rhinitis: Secondary | ICD-10-CM | POA: Diagnosis not present

## 2018-12-26 DIAGNOSIS — K5730 Diverticulosis of large intestine without perforation or abscess without bleeding: Secondary | ICD-10-CM | POA: Diagnosis not present

## 2018-12-26 DIAGNOSIS — Z0001 Encounter for general adult medical examination with abnormal findings: Secondary | ICD-10-CM | POA: Diagnosis not present

## 2018-12-26 DIAGNOSIS — K219 Gastro-esophageal reflux disease without esophagitis: Secondary | ICD-10-CM | POA: Diagnosis not present

## 2018-12-26 DIAGNOSIS — F411 Generalized anxiety disorder: Secondary | ICD-10-CM | POA: Diagnosis not present

## 2018-12-26 DIAGNOSIS — Z6824 Body mass index (BMI) 24.0-24.9, adult: Secondary | ICD-10-CM | POA: Diagnosis not present

## 2018-12-26 DIAGNOSIS — K644 Residual hemorrhoidal skin tags: Secondary | ICD-10-CM | POA: Diagnosis not present

## 2018-12-26 DIAGNOSIS — E559 Vitamin D deficiency, unspecified: Secondary | ICD-10-CM | POA: Diagnosis not present

## 2018-12-26 DIAGNOSIS — K635 Polyp of colon: Secondary | ICD-10-CM | POA: Diagnosis not present

## 2018-12-26 DIAGNOSIS — J4540 Moderate persistent asthma, uncomplicated: Secondary | ICD-10-CM | POA: Diagnosis not present

## 2019-01-26 DIAGNOSIS — L821 Other seborrheic keratosis: Secondary | ICD-10-CM | POA: Diagnosis not present

## 2019-01-26 DIAGNOSIS — D2322 Other benign neoplasm of skin of left ear and external auricular canal: Secondary | ICD-10-CM | POA: Diagnosis not present

## 2019-01-26 DIAGNOSIS — Z08 Encounter for follow-up examination after completed treatment for malignant neoplasm: Secondary | ICD-10-CM | POA: Diagnosis not present

## 2019-01-26 DIAGNOSIS — Z85828 Personal history of other malignant neoplasm of skin: Secondary | ICD-10-CM | POA: Diagnosis not present

## 2019-01-28 ENCOUNTER — Other Ambulatory Visit: Payer: Self-pay | Admitting: Cardiology

## 2019-02-22 ENCOUNTER — Other Ambulatory Visit: Payer: Self-pay | Admitting: Cardiology

## 2019-03-16 ENCOUNTER — Ambulatory Visit: Payer: PPO

## 2019-03-24 ENCOUNTER — Ambulatory Visit: Payer: PPO | Attending: Internal Medicine

## 2019-03-24 DIAGNOSIS — Z23 Encounter for immunization: Secondary | ICD-10-CM | POA: Insufficient documentation

## 2019-03-24 NOTE — Progress Notes (Signed)
   Covid-19 Vaccination Clinic  Name:  Jean Rivas    MRN: [id number] DOB: 07/23/1950  03/24/2019  [name] was observed post Covid-19 immunization for 15 minutes without incidence. She was provided with Vaccine Information Sheet and instruction to access the V-Safe system.   [name] was instructed to call 911 with any severe reactions post vaccine: Marland Kitchen Difficulty breathing  . Swelling of your face and throat  . A fast heartbeat  . A bad rash all over your body  . Dizziness and weakness    Immunizations Administered    Name Date Dose VIS Date Route   Pfizer COVID-19 Vaccine 03/24/2019  4:40 PM 0.3 mL 01/27/2019 Intramuscular   Manufacturer: [place]   Lot: [id number]   [place]: [id number]

## 2019-04-06 ENCOUNTER — Ambulatory Visit: Payer: PPO

## 2019-04-18 ENCOUNTER — Ambulatory Visit: Payer: PPO | Attending: Internal Medicine

## 2019-04-18 ENCOUNTER — Ambulatory Visit: Payer: PPO

## 2019-04-18 DIAGNOSIS — Z23 Encounter for immunization: Secondary | ICD-10-CM | POA: Insufficient documentation

## 2019-04-18 NOTE — Progress Notes (Signed)
   Covid-19 Vaccination Clinic  Name:  Jean Rivas    MRN: [id number] DOB: 05-26-1950  04/18/2019  [name] was observed post Covid-19 immunization for 15 minutes without incident. She was provided with Vaccine Information Sheet and instruction to access the V-Safe system.   [name] was instructed to call 911 with any severe reactions post vaccine: Marland Kitchen Difficulty breathing  . Swelling of face and throat  . A fast heartbeat  . A bad rash all over body  . Dizziness and weakness   Immunizations Administered    Name Date Dose VIS Date Route   Pfizer COVID-19 Vaccine 04/18/2019  2:03 PM 0.3 mL 01/27/2019 Intramuscular   Manufacturer: [place]   Lot: [id number]   [place]: [id number]

## 2019-05-01 DIAGNOSIS — C44319 Basal cell carcinoma of skin of other parts of face: Secondary | ICD-10-CM | POA: Diagnosis not present

## 2019-05-19 ENCOUNTER — Encounter: Payer: Self-pay | Admitting: *Deleted

## 2019-05-19 ENCOUNTER — Encounter: Payer: Self-pay | Admitting: Cardiology

## 2019-05-19 ENCOUNTER — Other Ambulatory Visit: Payer: Self-pay

## 2019-05-19 ENCOUNTER — Ambulatory Visit (INDEPENDENT_AMBULATORY_CARE_PROVIDER_SITE_OTHER): Payer: PPO | Admitting: Cardiology

## 2019-05-19 VITALS — BP 118/64 | HR 73 | Ht 68.0 in | Wt 154.4 lb

## 2019-05-19 DIAGNOSIS — I10 Essential (primary) hypertension: Secondary | ICD-10-CM | POA: Diagnosis not present

## 2019-05-19 DIAGNOSIS — R002 Palpitations: Secondary | ICD-10-CM

## 2019-05-19 DIAGNOSIS — I447 Left bundle-branch block, unspecified: Secondary | ICD-10-CM | POA: Diagnosis not present

## 2019-05-19 NOTE — Progress Notes (Signed)
Clinical Summary [name] is a 69 y.o.female seen today for follow up of the following medical problems.  1. LBBB - chronic for several years. Prior stress testing and echo overall unremarkable for significant cardiac disease    2. Palpitations - noted PACs and PVCs on EKG, medically managed with beta blocker. Has not had to wear a monitor -  Limited caffeine - mild infrequent symptoms since last visit   3. HTN - medical therapy limited due to some prior orthostatic dizziness. - she is compliant with meds    4. Reactive airway disease -compliant with inhalers - breathing improved with spiriva initially - some recent SOB with higher levels of exertion   Completed covid vaccine.   Past Medical History:  Diagnosis Date   Anxiety    Asthma    GERD (gastroesophageal reflux disease)    Heart palpitations    LBBB (left bundle Jean Rivas block)    Mildly reduced LVEF ~45-50%, Gr 1 DD, Mild MR & TR; 2010 Myoivew read as "mild" aical ischemia - LOW RISK (not acted upon)   RAD (reactive airway disease)     exacerbated by allergies   Seasonal allergies      Allergies  Allergen Reactions   Penicillins Itching    Did it involve swelling of the face/tongue/throat, SOB, or low BP? No Did it involve sudden or severe rash/hives, skin peeling, or any reaction on the inside of your mouth or nose? No Did you need to seek medical attention at a hospital or doctor's office? No When did it last happen?10+ years If all above answers are NO, may proceed with cephalosporin use.    Pantoprazole     Urinary retention issues      Current Outpatient Medications  Medication Sig Dispense Refill   ADVAIR DISKUS 250-50 MCG/DOSE AEPB Inhale 1 puff into the lungs every evening.      albuterol (PROVENTIL HFA;VENTOLIN HFA) 108 (90 BASE) MCG/ACT inhaler Inhale 1-2 puffs into the lungs every 6 (six) hours as needed for wheezing or shortness of breath.      aspirin EC 81 MG tablet Take 1 tablet (81 mg total) by mouth daily.     cetirizine (ZYRTEC) 10 MG tablet Take 10 mg by mouth daily.     diphenhydrAMINE (BENADRYL) 25 MG tablet Take 25 mg by mouth at bedtime as needed for allergies.     esomeprazole (NEXIUM) 20 MG capsule Take 20 mg by mouth every evening.      fluticasone (FLONASE) 50 MCG/ACT nasal spray Place 1 spray into both nostrils daily as needed for allergies or rhinitis.     losartan (COZAAR) 25 MG tablet TAKE 1 TABLET BY MOUTH EVERY DAY 90 tablet 1   metoprolol tartrate (LOPRESSOR) 25 MG tablet TAKE 1/2 TABLET BY MOUTH TWICE DAILY 30 tablet 0   pantoprazole (PROTONIX) 40 MG tablet Take 1 tablet (40 mg total) by mouth daily. (Patient not taking: Reported on 03/11/2018) 90 tablet 3   tiotropium (SPIRIVA HANDIHALER) 18 MCG inhalation capsule Place 1 capsule into inhaler and inhale daily.     triamcinolone cream (KENALOG) 0.1 % Apply 1 application topically daily as needed (eczema).     No current facility-administered medications for this visit.     Past Surgical History:  Procedure Laterality Date   2D Echocardiogram  08/28/2009   EF 45-50%, impaired (stage1) diastolic dysfunction, mild regurg of the mitral and tricuspid valves   BIOPSY  03/23/2018   Procedure: BIOPSY;  Surgeon:  Rogene Houston, MD;  [facility];  Service: Endoscopy;;  splenic flexure polyp   COLONOSCOPY N/A 03/23/2018   Procedure: COLONOSCOPY;  Surgeon: Rogene Houston, MD;  [facility];  Service: Endoscopy;  Laterality: N/A;  1:00   EYE SURGERY Bilateral    cataract   Lexiscan Myoview  03/26/2008   Mild ischemia in apical regions, post-stress EF 67%, nondiagnostic EKG, low-risk abnormal study     Allergies  Allergen Reactions   Penicillins Itching    Did it involve swelling of the face/tongue/throat, SOB, or low BP? No Did it involve sudden or severe rash/hives, skin peeling, or any reaction on the inside of your mouth or  nose? No Did you need to seek medical attention at a hospital or doctor's office? No When did it last happen?10+ years If all above answers are NO, may proceed with cephalosporin use.    Pantoprazole     Urinary retention issues       Family History  Problem Relation Age of Onset   Lung disease Father        Contracted in [place]   Diabetes Mother      Social History [name] reports that she has never smoked. She has never used smokeless tobacco. [name] reports current alcohol use.   Review of Systems CONSTITUTIONAL: No weight loss, fever, chills, weakness or fatigue.  HEENT: Eyes: No visual loss, blurred vision, double vision or yellow sclerae.No hearing loss, sneezing, congestion, runny nose or sore throat.  SKIN: No rash or itching.  CARDIOVASCULAR: per hpi RESPIRATORY:per hpi GASTROINTESTINAL: No anorexia, nausea, vomiting or diarrhea. No abdominal pain or blood.  GENITOURINARY: No burning on urination, no polyuria NEUROLOGICAL: No headache, dizziness, syncope, paralysis, ataxia, numbness or tingling in the extremities. No change in bowel or bladder control.  MUSCULOSKELETAL: No muscle, back pain, joint pain or stiffness.  LYMPHATICS: No enlarged nodes. No history of splenectomy.  PSYCHIATRIC: No history of depression or anxiety.  ENDOCRINOLOGIC: No reports of sweating, cold or heat intolerance. No polyuria or polydipsia.  Marland Kitchen   Physical Examination Today's Vitals   05/19/19 1259  BP: 118/64  Pulse: 73  SpO2: 99%  Weight: 154 lb 6.4 oz (70 kg)  Height: 5\' 8"  (1.727 m)   Body mass index is 23.48 kg/m.  Gen: resting comfortably, no acute distress HEENT: no scleral icterus, pupils equal round and reactive, no palptable cervical adenopathy,  CV: RRR, no m/r/g no jvd Resp: Clear to auscultation bilaterally GI: abdomen is soft, non-tender, non-distended, normal bowel sounds, no hepatosplenomegaly MSK: extremities are warm, no edema.  Skin: warm,  no rash Neuro:  no focal deficits Psych: appropriate affect   Diagnostic Studies  08/2009 echo LVEF [id number], grade I diastolic dysfunction, mild MR and TR  03/2008 Lexiscan: mild apical ischemia, low risk.  11/2016 echo Study Conclusions  - Left ventricle: The cavity size was normal. Wall thickness was normal. Systolic function was normal. The estimated ejection fraction was in the range of 50% to 55%. Wall motion was normal; there were no regional wall motion abnormalities. Doppler parameters are consistent with abnormal left ventricular relaxation (grade 1 diastolic dysfunction). - Aortic valve: Mildly calcified annulus. Trileaflet. - Mitral valve: There was trivial regurgitation. - Right atrium: Central venous pressure (est): 3 mm Hg. - Atrial septum: No defect or patent foramen ovale was identified. - Tricuspid valve: There was trivial regurgitation. - Pulmonary arteries: PA peak pressure: 11 mm Hg (S). - Pericardium, extracardiac: A prominent  pericardial fat pad was present.  Impressions:  - Normal LV wall thickness with LVEF 50-55% and grade 1 diastolic dysfunction. Mildly calcified aortic annulus. Trivial tricuspid regurgitation.   Assessment and Plan   1. LBBB - chronic, no major underlying structural heart disease by prior testing - continue to monitor at this time - EKG today show SR, chronic LBBB  2. Palpitations - mild infrequent symptoms, prior PACs and PVCs on EKGs.  - if progression of symptoms would obtain cardiac monitor  3. HTN -at goal, continue current meds  4. SOB - suspect related to deconditioning and her chronic lung disease - historically cardiac testing has been bening - monitor at this time, she will call if progressing symptoms. May need referal to new pulmonogist since Dr Luan Pulling retired.     F/u  1 year  Arnoldo Lenis, M.D.

## 2019-05-19 NOTE — Patient Instructions (Signed)

## 2019-06-01 DIAGNOSIS — Z08 Encounter for follow-up examination after completed treatment for malignant neoplasm: Secondary | ICD-10-CM | POA: Diagnosis not present

## 2019-06-01 DIAGNOSIS — Z85828 Personal history of other malignant neoplasm of skin: Secondary | ICD-10-CM | POA: Diagnosis not present

## 2019-06-21 DIAGNOSIS — Z13220 Encounter for screening for lipoid disorders: Secondary | ICD-10-CM | POA: Diagnosis not present

## 2019-06-21 DIAGNOSIS — K219 Gastro-esophageal reflux disease without esophagitis: Secondary | ICD-10-CM | POA: Diagnosis not present

## 2019-06-21 DIAGNOSIS — E559 Vitamin D deficiency, unspecified: Secondary | ICD-10-CM | POA: Diagnosis not present

## 2019-06-21 DIAGNOSIS — R5383 Other fatigue: Secondary | ICD-10-CM | POA: Diagnosis not present

## 2019-06-28 DIAGNOSIS — J4540 Moderate persistent asthma, uncomplicated: Secondary | ICD-10-CM | POA: Diagnosis not present

## 2019-06-28 DIAGNOSIS — K635 Polyp of colon: Secondary | ICD-10-CM | POA: Diagnosis not present

## 2019-06-28 DIAGNOSIS — K5730 Diverticulosis of large intestine without perforation or abscess without bleeding: Secondary | ICD-10-CM | POA: Diagnosis not present

## 2019-06-28 DIAGNOSIS — F411 Generalized anxiety disorder: Secondary | ICD-10-CM | POA: Diagnosis not present

## 2019-06-28 DIAGNOSIS — Z6823 Body mass index (BMI) 23.0-23.9, adult: Secondary | ICD-10-CM | POA: Diagnosis not present

## 2019-06-28 DIAGNOSIS — E559 Vitamin D deficiency, unspecified: Secondary | ICD-10-CM | POA: Diagnosis not present

## 2019-06-28 DIAGNOSIS — Z1389 Encounter for screening for other disorder: Secondary | ICD-10-CM | POA: Diagnosis not present

## 2019-06-28 DIAGNOSIS — Z1331 Encounter for screening for depression: Secondary | ICD-10-CM | POA: Diagnosis not present

## 2019-08-06 ENCOUNTER — Other Ambulatory Visit: Payer: Self-pay | Admitting: Cardiology

## 2019-09-13 DIAGNOSIS — L905 Scar conditions and fibrosis of skin: Secondary | ICD-10-CM | POA: Diagnosis not present

## 2019-09-13 DIAGNOSIS — D485 Neoplasm of uncertain behavior of skin: Secondary | ICD-10-CM | POA: Diagnosis not present

## 2019-11-22 NOTE — Progress Notes (Signed)
Patient profile: Jean Rivas is a 69 y.o. female seen for follow-up.  Last seen February 2020 for colonoscopy (done for positive cologaurd).  History of Present Illness: Jean Rivas is seen today for follow up and new onset of abd pain.  She reports over the past year few months developing issues with intermittent right upper pain.  She reports 3 distinct episodes, one came large salad, one after spagetti & ice cream. Last episode occurred approximately 1 month ago.  During the episodes she has pain radiating from back to right upper quadrant.  Does not have nausea vomiting with the episodes.  Episodes do not resolve with antiacids or anti-gas medications and tend to last about an hour.  Between episodes GERD symptoms well controlled Protonix 40 mg once a day.  She denies any dysphagia. No milder abd pain between severe episodes.   Bowel habits vary in consistency. Typically tends to have loose stools, can go 2-3 times a day. Rare alternating constipation. Denies any blood in stool or abdominal pain.  She has some weight loss as below that is uintentional. She reports appetite is not as good as it used to be but denies specific post prandial abd pain.  Non-smoker.  No alcohol.  No frequent NSAIDs.  Wt Readings from Last 3 Encounters:  11/23/19 137 lb 12.8 oz (62.5 kg)  05/19/19 154 lb 6.4 oz (70 kg)  03/23/18 150 lb (68 kg)     Last Colonoscopy: 03/2018--  One small polyp at the splenic flexure. Biopsied. - Diverticulosis in the mid sigmoid colon. - External hemorrhoids  Path - Patient had single small polyp removed from splenic flexure and it is hyperplastic. Results given to patient. Next screening colonoscopy in 10 years.   Last Endoscopy: none prior    Past Medical History:  Past Medical History:  Diagnosis Date  . Anxiety   . Asthma   . GERD (gastroesophageal reflux disease)   . Heart palpitations   . LBBB (left bundle branch block)    Mildly reduced LVEF  ~45-50%, Gr 1 DD, Mild MR & TR; 2010 Myoivew read as "mild" aical ischemia - LOW RISK (not acted upon)  . RAD (reactive airway disease)     exacerbated by allergies  . Seasonal allergies     Problem List: Patient Active Problem List   Diagnosis Date Noted  . Positive colorectal cancer screening using Cologuard test 02/14/2018  . Palpitations 06/14/2013  . Dyspnea on exertion 11/13/2012  . LBBB (left bundle branch block) 11/13/2012  . Dyslipidemia 11/13/2012  . Hypertension, essential, benign 11/13/2012    Class: Chronic  . Seasonal allergies   . Exacerbation of reactive airway disease     Past Surgical History: Past Surgical History:  Procedure Laterality Date  . 2D Echocardiogram  08/28/2009   EF 45-50%, impaired (stage1) diastolic dysfunction, mild regurg of the mitral and tricuspid valves  . BIOPSY  03/23/2018   Procedure: BIOPSY;  Surgeon: Rogene Houston, MD;  [facility];  Service: Endoscopy;;  splenic flexure polyp  . COLONOSCOPY N/A 03/23/2018   Procedure: COLONOSCOPY;  Surgeon: Rogene Houston, MD;  [facility];  Service: Endoscopy;  Laterality: N/A;  1:00  . EYE SURGERY Bilateral    cataract  . Lexiscan Myoview  03/26/2008   Mild ischemia in apical regions, post-stress EF 67%, nondiagnostic EKG, low-risk abnormal study    Allergies: Allergies  Allergen Reactions  . Penicillins Itching    Did it involve swelling  of the face/tongue/throat, SOB, or low BP? No Did it involve sudden or severe rash/hives, skin peeling, or any reaction on the inside of your mouth or nose? No Did you need to seek medical attention at a hospital or doctor's office? No When did it last happen?10+ years If all above answers are "NO", may proceed with cephalosporin use.       Home Medications:  Current Outpatient Medications:  .  ADVAIR DISKUS 250-50 MCG/DOSE AEPB, Inhale 1 puff into the lungs every evening. , Disp: , Rfl:  .  albuterol (PROVENTIL HFA;VENTOLIN  HFA) 108 (90 BASE) MCG/ACT inhaler, Inhale 1-2 puffs into the lungs every 6 (six) hours as needed for wheezing or shortness of breath., Disp: , Rfl:  .  aspirin EC 81 MG tablet, Take 1 tablet (81 mg total) by mouth daily., Disp: , Rfl:  .  cetirizine (ZYRTEC) 10 MG tablet, Take 10 mg by mouth daily., Disp: , Rfl:  .  diphenhydrAMINE (BENADRYL) 25 MG tablet, Take 25 mg by mouth at bedtime as needed for allergies., Disp: , Rfl:  .  fluticasone (FLONASE) 50 MCG/ACT nasal spray, Place 1 spray into both nostrils daily as needed for allergies or rhinitis., Disp: , Rfl:  .  losartan (COZAAR) 25 MG tablet, TAKE 1 TABLET BY MOUTH EVERY DAY, Disp: 90 tablet, Rfl: 1 .  metoprolol tartrate (LOPRESSOR) 25 MG tablet, TAKE 1/2 TABLET BY MOUTH TWICE DAILY, Disp: 30 tablet, Rfl: 0 .  tiotropium (SPIRIVA HANDIHALER) 18 MCG inhalation capsule, Place 1 capsule into inhaler and inhale daily., Disp: , Rfl:  .  triamcinolone cream (KENALOG) 0.1 %, Apply 1 application topically daily as needed (eczema)., Disp: , Rfl:  .  pantoprazole (PROTONIX) 20 MG tablet, Take 1 tablet (20 mg total) by mouth daily. Take 30 min before breakfast, Disp: 90 tablet, Rfl: 3   Family History: family history includes Diabetes in her mother; Lung disease in her father.    Social History:   reports that she has never smoked. She has never used smokeless tobacco. She reports current alcohol use. She reports that she does not use drugs.   Review of Systems: Constitutional: Denies weight loss/weight gain  Eyes: No changes in vision. ENT: No oral lesions, sore throat.  GI: see HPI.  Heme/Lymph: No easy bruising.  CV: No chest pain.  GU: No hematuria.  Integumentary: No rashes.  Neuro: No headaches.  Psych: No depression/anxiety.  Endocrine: No heat/cold intolerance.  Allergic/Immunologic: No urticaria.  Resp: No cough, SOB.  Musculoskeletal: No joint swelling.    Physical Examination: BP 125/77 (BP Location: Right Arm, Patient  Position: Sitting, Cuff Size: Normal)   Pulse 81   Temp (!) 96.4 F (35.8 C) (Temporal)   Ht 5\' 8"  (1.727 m)   Wt 137 lb 12.8 oz (62.5 kg)   BMI 20.95 kg/m  Gen: NAD, alert and oriented x 4 HEENT: PEERLA, EOMI, Neck: supple, no JVD Chest: CTA bilaterally, no wheezes, crackles, or other adventitious sounds CV: RRR, no m/g/c/r Abd: soft, very mild TTP RUQ, ND, +BS in all four quadrants; no HSM, guarding, ridigity, or rebound tenderness Ext: no edema, well perfused with 2+ pulses, Skin: no rash or lesions noted on observed skin Lymph: no noted LAD  Data Reviewed:  No labs available in epic.    Assessment/Plan: Ms. Moss is a 69 y.o. female seen for follow-up.  1.  Right upper quadrant pain - 3 specific episodes that seem consistent with gallbladder etiology.  We will check  her LFTs today.  She is on 40 PPI daily and does not have a lot of reflux symptoms.  We will schedule ultrasound for evaluation.  She also notes weight  loss and is not as hungry, check TSH as well.  If ultrasound negative will need EGD for evaluation  2.  Change in bowel habits-chronic issue ongoing years, mild alternating constipation symptoms.  She will try fiber and probiotic. UTD on colonoscopy (2020)  3. GERD - asymptomatic on PPI 40mg  daily, will try 20mg  daily instead. She will contact me if develops symptoms w/ dose decrease.   Further recs pending [place] results - if negative needs EGD   Antoria was seen today for abdominal pain.  Diagnoses and all orders for this visit:  RUQ pain -     US Abdomen Complete; Future -     CBC with Differential -     COMPLETE METABOLIC PANEL WITH GFR -     TSH  Loss of weight -     TSH  Other orders -     pantoprazole (PROTONIX) 20 MG tablet; Take 1 tablet (20 mg total) by mouth daily. Take 30 min before breakfast        I personally performed the service, non-incident to. (WP)  Laurine Blazer, [facility]

## 2019-11-23 ENCOUNTER — Other Ambulatory Visit: Payer: Self-pay

## 2019-11-23 ENCOUNTER — Encounter (INDEPENDENT_AMBULATORY_CARE_PROVIDER_SITE_OTHER): Payer: Self-pay | Admitting: Gastroenterology

## 2019-11-23 ENCOUNTER — Encounter (INDEPENDENT_AMBULATORY_CARE_PROVIDER_SITE_OTHER): Payer: Self-pay | Admitting: *Deleted

## 2019-11-23 ENCOUNTER — Ambulatory Visit (INDEPENDENT_AMBULATORY_CARE_PROVIDER_SITE_OTHER): Payer: PPO | Admitting: Gastroenterology

## 2019-11-23 VITALS — BP 125/77 | HR 81 | Temp 96.4°F | Ht 68.0 in | Wt 137.8 lb

## 2019-11-23 DIAGNOSIS — R634 Abnormal weight loss: Secondary | ICD-10-CM

## 2019-11-23 DIAGNOSIS — R1011 Right upper quadrant pain: Secondary | ICD-10-CM

## 2019-11-23 MED ORDER — PANTOPRAZOLE SODIUM 20 MG PO TBEC: 20.0000 mg | DELAYED_RELEASE_TABLET | Freq: Every day | ORAL | 3 refills | Status: DC

## 2019-11-23 NOTE — Patient Instructions (Addendum)
Try Benefiber or Citrucel daily for fiber regularity - these are over the counter. You can try [employer] or Align probiotic as well.   We are checking labs and ultrasound

## 2019-11-24 LAB — COMPLETE METABOLIC PANEL WITH GFR
AG Ratio: 1.8 (calc) (ref 1.0–2.5)
ALT: 10 U/L (ref 6–29)
AST: 14 U/L (ref 10–35)
Albumin: 4.3 g/dL (ref 3.6–5.1)
Alkaline phosphatase (APISO): 36 U/L — ABNORMAL LOW (ref 37–153)
BUN/Creatinine Ratio: 17 (calc) (ref 6–22)
BUN: 17 mg/dL (ref 7–25)
CO2: 27 mmol/L (ref 20–32)
Calcium: 9.9 mg/dL (ref 8.6–10.4)
Chloride: 105 mmol/L (ref 98–110)
Creat: 0.98 mg/dL — ABNORMAL HIGH (ref 0.60–0.93)
GFR, Est African American: 67 mL/min/{1.73_m2} (ref >=60)
GFR, Est Non African American: 58 mL/min/{1.73_m2} — ABNORMAL LOW (ref >=60)
Globulin: 2.4 g/dL (calc) (ref 1.9–3.7)
Glucose, Bld: 81 mg/dL (ref 65–139)
Potassium: 4.4 mmol/L (ref 3.5–5.3)
Sodium: 142 mmol/L (ref 135–146)
Total Bilirubin: 1.7 mg/dL — ABNORMAL HIGH (ref 0.2–1.2)
Total Protein: 6.7 g/dL (ref 6.1–8.1)

## 2019-11-24 LAB — CBC WITH DIFFERENTIAL/PLATELET
Absolute Monocytes: 581 cells/uL (ref 200–950)
Basophils Absolute: 108 cells/uL (ref 0–200)
Basophils Relative: 1.3 %
Eosinophils Absolute: 390 cells/uL (ref 15–500)
Eosinophils Relative: 4.7 %
HCT: 40.5 % (ref 35.0–45.0)
Hemoglobin: 13.5 g/dL (ref 11.7–15.5)
Lymphs Abs: 2664 cells/uL (ref 850–3900)
MCH: 29.2 pg (ref 27.0–33.0)
MCHC: 33.3 g/dL (ref 32.0–36.0)
MCV: 87.5 fL (ref 80.0–100.0)
MPV: 10.9 fL (ref 7.5–12.5)
Monocytes Relative: 7 %
Neutro Abs: 4557 cells/uL (ref 1500–7800)
Neutrophils Relative %: 54.9 %
Platelets: 237 10*3/uL (ref 140–400)
RBC: 4.63 10*6/uL (ref 3.80–5.10)
RDW: 12.7 % (ref 11.0–15.0)
Total Lymphocyte: 32.1 %
WBC: 8.3 10*3/uL (ref 3.8–10.8)

## 2019-11-24 LAB — TSH: TSH: 0.85 mIU/L (ref 0.40–4.50)

## 2019-12-04 ENCOUNTER — Other Ambulatory Visit: Payer: Self-pay

## 2019-12-04 ENCOUNTER — Ambulatory Visit (HOSPITAL_COMMUNITY)
Admission: RE | Admit: 2019-12-04 | Discharge: 2019-12-04 | Disposition: A | Discharge status: Home / Self Care | Payer: PPO | Source: Ambulatory Visit | Attending: Gastroenterology | Admitting: Gastroenterology

## 2019-12-04 DIAGNOSIS — R1011 Right upper quadrant pain: Secondary | ICD-10-CM | POA: Diagnosis not present

## 2019-12-04 DIAGNOSIS — K8020 Calculus of gallbladder without cholecystitis without obstruction: Secondary | ICD-10-CM | POA: Diagnosis not present

## 2019-12-05 ENCOUNTER — Telehealth (INDEPENDENT_AMBULATORY_CARE_PROVIDER_SITE_OTHER): Payer: Self-pay | Admitting: Gastroenterology

## 2019-12-05 NOTE — Telephone Encounter (Signed)
Patient called regarding test results and follow up appointment - ph# [phone]

## 2019-12-06 NOTE — Telephone Encounter (Signed)
Noted - she can call us when ready to schedule surgery. Also should notify us if symptoms worsen in interim or gets severe long lasting RUQ pain, nausea, vomiting, fever, etc. Thanks.

## 2019-12-06 NOTE — Telephone Encounter (Signed)
Can you call patient and let her know her US showed gallstones as we suspected - I would like to send her to surgeon. Please let her know I'm at hospital today but can call her tomorrow. Thanks.

## 2019-12-06 NOTE — Telephone Encounter (Signed)
Jean Rivas is aware of results & recommendation she states that she will call us when she is ready to be referred, she states that she is going through a lot right now with her home, she is currently staying with a neighbor and she wants to get stable before having to have surgery

## 2019-12-06 NOTE — Telephone Encounter (Signed)
Patient called stated to call her cell ph# [phone]

## 2020-01-27 ENCOUNTER — Other Ambulatory Visit (INDEPENDENT_AMBULATORY_CARE_PROVIDER_SITE_OTHER): Payer: Self-pay | Admitting: Internal Medicine

## 2020-01-27 DIAGNOSIS — K219 Gastro-esophageal reflux disease without esophagitis: Secondary | ICD-10-CM

## 2020-05-01 ENCOUNTER — Other Ambulatory Visit: Payer: Self-pay | Admitting: Cardiology

## 2020-08-26 ENCOUNTER — Other Ambulatory Visit: Payer: Self-pay | Admitting: *Deleted

## 2020-08-26 MED ORDER — LOSARTAN POTASSIUM 25 MG PO TABS: 25.0000 mg | ORAL_TABLET | Freq: Every day | ORAL | 2 refills | Status: DC

## 2020-09-11 DIAGNOSIS — K810 Acute cholecystitis: Secondary | ICD-10-CM | POA: Diagnosis not present

## 2020-09-11 DIAGNOSIS — Z6822 Body mass index (BMI) 22.0-22.9, adult: Secondary | ICD-10-CM | POA: Diagnosis not present

## 2020-09-11 DIAGNOSIS — K8020 Calculus of gallbladder without cholecystitis without obstruction: Secondary | ICD-10-CM | POA: Diagnosis not present

## 2020-09-13 ENCOUNTER — Other Ambulatory Visit: Payer: Self-pay | Admitting: Family Medicine

## 2020-09-13 DIAGNOSIS — K8080 Other cholelithiasis without obstruction: Secondary | ICD-10-CM

## 2020-10-01 ENCOUNTER — Other Ambulatory Visit: Payer: Self-pay

## 2020-10-01 ENCOUNTER — Encounter: Payer: Self-pay | Admitting: General Surgery

## 2020-10-01 ENCOUNTER — Ambulatory Visit: Payer: PPO | Admitting: General Surgery

## 2020-10-01 VITALS — BP 122/78 | HR 74 | Temp 97.8°F | Resp 16 | Ht 68.0 in | Wt 135.0 lb

## 2020-10-01 DIAGNOSIS — K8020 Calculus of gallbladder without cholecystitis without obstruction: Secondary | ICD-10-CM

## 2020-10-02 NOTE — Progress Notes (Signed)
Jean Rivas; [id number]; March 09, 1950   HPI Patient is a 70 year old white female who was referred to my care by Drs. Rehman and Burdine for evaluation treatment of biliary colic secondary to cholelithiasis.  Patient has a known history of cholelithiasis and was recommended to see me approximately 1 year ago.  Patient states she was not too symptomatic at that time.  More recently, she states she is having increasing episodes of right upper quadrant abdominal pain with radiation to the right flank and shoulder, indigestion, bloating, and reflux.  There is no particular trigger foods that brings on attack.  Her last attack was 24 hours earlier.  They usually resolve on their own.  No fever, chills, jaundice have been noted.  She does have a cardiac history and last saw cardiologist 1 year ago.  She thought she was supposed to see the cardiologist every 2 years, but his note says yearly. Past Medical History:  Diagnosis Date   Anxiety    Asthma    GERD (gastroesophageal reflux disease)    Heart palpitations    LBBB (left bundle branch block)    Mildly reduced LVEF ~45-50%, Gr 1 DD, Mild MR & TR; 2010 Myoivew read as "mild" aical ischemia - LOW RISK (not acted upon)   RAD (reactive airway disease)     exacerbated by allergies   Seasonal allergies     Past Surgical History:  Procedure Laterality Date   2D Echocardiogram  08/28/2009   EF 45-50%, impaired (stage1) diastolic dysfunction, mild regurg of the mitral and tricuspid valves   BIOPSY  03/23/2018   Procedure: BIOPSY;  Surgeon: Rogene Houston, MD;  [facility];  Service: Endoscopy;;  splenic flexure polyp   COLONOSCOPY N/A 03/23/2018   Procedure: COLONOSCOPY;  Surgeon: Rogene Houston, MD;  [facility];  Service: Endoscopy;  Laterality: N/A;  1:00   EYE SURGERY Bilateral    cataract   Lexiscan Myoview  03/26/2008   Mild ischemia in apical regions, post-stress EF 67%, nondiagnostic EKG, low-risk abnormal study     Family History  Problem Relation Age of Onset   Lung disease Father        Contracted in [place]   Diabetes Mother     Current Outpatient Medications on File Prior to Visit  Medication Sig Dispense Refill   ADVAIR DISKUS 250-50 MCG/DOSE AEPB Inhale 1 puff into the lungs every evening.      albuterol (PROVENTIL HFA;VENTOLIN HFA) 108 (90 BASE) MCG/ACT inhaler Inhale 1-2 puffs into the lungs every 6 (six) hours as needed for wheezing or shortness of breath.     aspirin EC 81 MG tablet Take 81 mg by mouth daily as needed.     diphenhydrAMINE (BENADRYL) 25 MG tablet Take 25 mg by mouth at bedtime as needed for allergies.     famotidine (PEPCID) 20 MG tablet Take 20 mg by mouth 2 (two) times daily.     fluticasone (FLONASE) 50 MCG/ACT nasal spray Place 1 spray into both nostrils daily as needed for allergies or rhinitis.     losartan (COZAAR) 25 MG tablet Take 1 tablet (25 mg total) by mouth daily. 90 tablet 2   metoprolol tartrate (LOPRESSOR) 25 MG tablet TAKE 1/2 TABLET BY MOUTH TWICE DAILY 90 tablet 3   tiotropium (SPIRIVA) 18 MCG inhalation capsule Place 1 capsule into inhaler and inhale daily.     triamcinolone cream (KENALOG) 0.1 % Apply 1 application topically daily as needed (eczema).  No current facility-administered medications on file prior to visit.    Allergies  Allergen Reactions   Penicillins Itching    Did it involve swelling of the face/tongue/throat, SOB, or low BP? No Did it involve sudden or severe rash/hives, skin peeling, or any reaction on the inside of your mouth or nose? No Did you need to seek medical attention at a hospital or doctor's office? No When did it last happen?      10+ years If all above answers are "NO", may proceed with cephalosporin use.     Social History   Substance and Sexual Activity  Alcohol Use Yes   Comment: very seldom    Social History   Tobacco Use  Smoking Status Never  Smokeless Tobacco Never    Review of Systems   Constitutional: Negative.   HENT:  Positive for sinus pain.   Eyes: Negative.   Respiratory: Negative.    Cardiovascular: Negative.   Gastrointestinal:  Positive for abdominal pain, heartburn and nausea.  Genitourinary: Negative.   Musculoskeletal:  Positive for back pain and joint pain.  Skin:  Positive for rash.  Neurological: Negative.   Endo/Heme/Allergies: Negative.   Psychiatric/Behavioral: Negative.     Objective   Vitals:   10/01/20 1510  BP: 122/78  Pulse: 74  Resp: 16  Temp: 97.8 F (36.6 C)  SpO2: 98%    Physical Exam Vitals reviewed.  Constitutional:      Appearance: Normal appearance. She is not ill-appearing.  HENT:     Head: Normocephalic and atraumatic.  Eyes:     General: No scleral icterus. Cardiovascular:     Rate and Rhythm: Normal rate and regular rhythm.     Heart sounds: Normal heart sounds. No murmur heard.   No friction rub. No gallop.  Pulmonary:     Effort: Pulmonary effort is normal. No respiratory distress.     Breath sounds: Normal breath sounds. No stridor. No wheezing, rhonchi or rales.  Abdominal:     General: Abdomen is flat. Bowel sounds are normal. There is no distension.     Palpations: Abdomen is soft. There is no mass.     Tenderness: There is no abdominal tenderness. There is no guarding or rebound.     Hernia: No hernia is present.     Comments: She does point to the right upper quadrant when I do palpate the abdomen as the area where the pain can be occurring.  Skin:    General: Skin is warm and dry.  Neurological:     Mental Status: She is alert and oriented to person, place, and time.  Primary care notes reviewed. GI notes reviewed  Assessment  Biliary colic, cholelithiasis History of left bundle branch block Plan  Patient continues to be hesitant on undergoing a cholecystectomy.  I told her that at this point, there is nothing else to offer her.  I did tell her that she should see Dr. Harl Bowie of cardiology and  undergo preoperative clearance.  After that appointment, I told her to return to my care and we will discuss things further.  She understands and agrees.

## 2020-11-21 ENCOUNTER — Encounter: Payer: Self-pay | Admitting: Cardiology

## 2020-11-21 ENCOUNTER — Encounter: Payer: Self-pay | Admitting: *Deleted

## 2020-11-21 ENCOUNTER — Other Ambulatory Visit: Payer: Self-pay

## 2020-11-21 ENCOUNTER — Ambulatory Visit: Payer: PPO | Admitting: Cardiology

## 2020-11-21 VITALS — BP 116/60 | HR 87 | Ht 68.0 in | Wt 136.2 lb

## 2020-11-21 DIAGNOSIS — Z01810 Encounter for preprocedural cardiovascular examination: Secondary | ICD-10-CM | POA: Diagnosis not present

## 2020-11-21 DIAGNOSIS — I10 Essential (primary) hypertension: Secondary | ICD-10-CM | POA: Diagnosis not present

## 2020-11-21 DIAGNOSIS — I447 Left bundle-branch block, unspecified: Secondary | ICD-10-CM

## 2020-11-21 DIAGNOSIS — R002 Palpitations: Secondary | ICD-10-CM | POA: Diagnosis not present

## 2020-11-21 MED ORDER — METOPROLOL TARTRATE 25 MG PO TABS: 25.0000 mg | ORAL_TABLET | Freq: Two times a day (BID) | ORAL | 3 refills | Status: DC

## 2020-11-21 NOTE — Progress Notes (Signed)
Clinical Summary Ms. Vanderheiden is a 70 y.o.femaleseen today for follow up of the following medical problems.    1. LBBB - chronic for several years. Prior stress testing and echo overall unremarkable for significant cardiac disease       2. Palpitations - noted PACs and PVCs on EKG, medically managed with beta blocker. Has not had to wear a monitor  -  Limited caffeine  - can have some hard beats at times, happen somewhat frequently     3. HTN - medical therapy limited due to some prior orthostatic dizziness.  - she is compliant with meds       4. Reactive airway disease - compliant with inhalers - breathing improved with spiriva initially - some recent SOB with higher levels of exertion     5. Preoperative evaluation - considering gallbladder surgery - walks up flight of stairs at home regularly without symptoms  Past Medical History:  Diagnosis Date   Anxiety    Asthma    GERD (gastroesophageal reflux disease)    Heart palpitations    LBBB (left bundle Jessenia Filippone block)    Mildly reduced LVEF ~45-50%, Gr 1 DD, Mild MR & TR; 2010 Myoivew read as "mild" aical ischemia - LOW RISK (not acted upon)   RAD (reactive airway disease)     exacerbated by allergies   Seasonal allergies      Allergies  Allergen Reactions   Penicillins Itching    Did it involve swelling of the face/tongue/throat, SOB, or low BP? No Did it involve sudden or severe rash/hives, skin peeling, or any reaction on the inside of your mouth or nose? No Did you need to seek medical attention at a hospital or doctor's office? No When did it last happen?      10+ years If all above answers are "NO", may proceed with cephalosporin use.      Current Outpatient Medications  Medication Sig Dispense Refill   ADVAIR DISKUS 250-50 MCG/DOSE AEPB Inhale 1 puff into the lungs every evening.      albuterol (PROVENTIL HFA;VENTOLIN HFA) 108 (90 BASE) MCG/ACT inhaler Inhale 1-2 puffs into the lungs every 6  (six) hours as needed for wheezing or shortness of breath.     aspirin EC 81 MG tablet Take 81 mg by mouth daily as needed.     diphenhydrAMINE (BENADRYL) 25 MG tablet Take 25 mg by mouth at bedtime as needed for allergies.     famotidine (PEPCID) 20 MG tablet Take 20 mg by mouth 2 (two) times daily.     fluticasone (FLONASE) 50 MCG/ACT nasal spray Place 1 spray into both nostrils daily as needed for allergies or rhinitis.     losartan (COZAAR) 25 MG tablet Take 1 tablet (25 mg total) by mouth daily. 90 tablet 2   metoprolol tartrate (LOPRESSOR) 25 MG tablet TAKE 1/2 TABLET BY MOUTH TWICE DAILY 90 tablet 3   tiotropium (SPIRIVA) 18 MCG inhalation capsule Place 1 capsule into inhaler and inhale daily.     triamcinolone cream (KENALOG) 0.1 % Apply 1 application topically daily as needed (eczema).     No current facility-administered medications for this visit.     Past Surgical History:  Procedure Laterality Date   2D Echocardiogram  08/28/2009   EF 45-50%, impaired (stage1) diastolic dysfunction, mild regurg of the mitral and tricuspid valves   BIOPSY  03/23/2018   Procedure: BIOPSY;  Surgeon: Rogene Houston, MD;  [facility];  Service:  Endoscopy;;  splenic flexure polyp   COLONOSCOPY N/A 03/23/2018   Procedure: COLONOSCOPY;  Surgeon: Rogene Houston, MD;  [facility];  Service: Endoscopy;  Laterality: N/A;  1:00   EYE SURGERY Bilateral    cataract   Lexiscan Myoview  03/26/2008   Mild ischemia in apical regions, post-stress EF 67%, nondiagnostic EKG, low-risk abnormal study     Allergies  Allergen Reactions   Penicillins Itching    Did it involve swelling of the face/tongue/throat, SOB, or low BP? No Did it involve sudden or severe rash/hives, skin peeling, or any reaction on the inside of your mouth or nose? No Did you need to seek medical attention at a hospital or doctor's office? No When did it last happen?      10+ years If all above answers are "NO", may  proceed with cephalosporin use.       Family History  Problem Relation Age of Onset   Lung disease Father        Contracted in [place]   Diabetes Mother      Social History Ms. Colledge reports that she has never smoked. She has never used smokeless tobacco. Ms. Lanagan reports current alcohol use.   Review of Systems CONSTITUTIONAL: No weight loss, fever, chills, weakness or fatigue.  HEENT: Eyes: No visual loss, blurred vision, double vision or yellow sclerae.No hearing loss, sneezing, congestion, runny nose or sore throat.  SKIN: No rash or itching.  CARDIOVASCULAR: per hpi RESPIRATORY: No shortness of breath, cough or sputum.  GASTROINTESTINAL: No anorexia, nausea, vomiting or diarrhea. No abdominal pain or blood.  GENITOURINARY: No burning on urination, no polyuria NEUROLOGICAL: No headache, dizziness, syncope, paralysis, ataxia, numbness or tingling in the extremities. No change in bowel or bladder control.  MUSCULOSKELETAL: No muscle, back pain, joint pain or stiffness.  LYMPHATICS: No enlarged nodes. No history of splenectomy.  PSYCHIATRIC: No history of depression or anxiety.  ENDOCRINOLOGIC: No reports of sweating, cold or heat intolerance. No polyuria or polydipsia.  Marland Kitchen   Physical Examination Today's Vitals   11/21/20 0818  BP: 116/60  Pulse: 87  SpO2: 99%  Weight: 136 lb 3.2 oz (61.8 kg)  Height: 5\' 8"  (1.727 m)   Body mass index is 20.71 kg/m.  Gen: resting comfortably, no acute distress HEENT: no scleral icterus, pupils equal round and reactive, no palptable cervical adenopathy,  CV: RRR, no mr/g no jvd Resp: Clear to auscultation bilaterally GI: abdomen is soft, non-tender, non-distended, normal bowel sounds, no hepatosplenomegaly MSK: extremities are warm, no edema.  Skin: warm, no rash Neuro:  no focal deficits Psych: appropriate affect   Diagnostic Studies  08/2009 echo LVEF 57-01%, grade I diastolic dysfunction, mild MR and TR   03/2008  Lexiscan: mild apical ischemia, low risk.    11/2016 echo Study Conclusions   - Left ventricle: The cavity size was normal. Wall thickness was   normal. Systolic function was normal. The estimated ejection   fraction was in the range of 50% to 55%. Wall motion was normal;   there were no regional wall motion abnormalities. Doppler   parameters are consistent with abnormal left ventricular   relaxation (grade 1 diastolic dysfunction). - Aortic valve: Mildly calcified annulus. Trileaflet. - Mitral valve: There was trivial regurgitation. - Right atrium: Central venous pressure (est): 3 mm Hg. - Atrial septum: No defect or patent foramen ovale was identified. - Tricuspid valve: There was trivial regurgitation. - Pulmonary arteries: PA peak pressure: 11 mm Hg (S). -  Pericardium, extracardiac: A prominent pericardial fat pad was   present.   Impressions:   - Normal LV wall thickness with LVEF 50-55% and grade 1 diastolic   dysfunction. Mildly calcified aortic annulus. Trivial tricuspid   regurgitation.     Assessment and Plan  1. LBBB - chronic, no major underlying structural heart disease by prior testing - EKG today shows SR, chronic LBBB - no further testing indicated   2. Palpitations -some recent symtoms, increase lopressor to 25mg  bid   3. HTN -bp at goal, continue to monitor. If issues with low bp after lopressor increase could lower her losartan.    4. Preoperative evaluation -considering gallbladder surgery - tolerates greater than 4 METs regularlty without symptoms - chronic LBBB with otherwise benign testing over the years including stress testing and echo - recommend proceeding with surgery as planned.    F/u 1 year   Arnoldo Lenis, M.D.

## 2020-11-21 NOTE — Patient Instructions (Signed)
Medication Instructions:  Increase Lopressor to 25mg  twice a day.   Continue all other medications.     Labwork: none  Testing/Procedures: none  Follow-Up:  Your physician wants you to follow up in:  1 year.  You will receive a reminder letter in the mail one-two months in advance.  If you don't receive a letter, please call our office to schedule the follow up appointment    Any Other Special Instructions Will Be Listed Below (If Applicable).   If you need a refill on your cardiac medications before your next appointment, please call your pharmacy.

## 2020-12-23 DIAGNOSIS — K219 Gastro-esophageal reflux disease without esophagitis: Secondary | ICD-10-CM | POA: Diagnosis not present

## 2020-12-23 DIAGNOSIS — Z23 Encounter for immunization: Secondary | ICD-10-CM | POA: Diagnosis not present

## 2020-12-23 DIAGNOSIS — F411 Generalized anxiety disorder: Secondary | ICD-10-CM | POA: Diagnosis not present

## 2020-12-23 DIAGNOSIS — K5730 Diverticulosis of large intestine without perforation or abscess without bleeding: Secondary | ICD-10-CM | POA: Diagnosis not present

## 2020-12-23 DIAGNOSIS — R7989 Other specified abnormal findings of blood chemistry: Secondary | ICD-10-CM | POA: Diagnosis not present

## 2020-12-23 DIAGNOSIS — J4540 Moderate persistent asthma, uncomplicated: Secondary | ICD-10-CM | POA: Diagnosis not present

## 2020-12-23 DIAGNOSIS — K8020 Calculus of gallbladder without cholecystitis without obstruction: Secondary | ICD-10-CM | POA: Diagnosis not present

## 2020-12-23 DIAGNOSIS — K635 Polyp of colon: Secondary | ICD-10-CM | POA: Diagnosis not present

## 2020-12-23 DIAGNOSIS — Z0001 Encounter for general adult medical examination with abnormal findings: Secondary | ICD-10-CM | POA: Diagnosis not present

## 2020-12-23 DIAGNOSIS — E559 Vitamin D deficiency, unspecified: Secondary | ICD-10-CM | POA: Diagnosis not present

## 2020-12-23 DIAGNOSIS — J302 Other seasonal allergic rhinitis: Secondary | ICD-10-CM | POA: Diagnosis not present

## 2020-12-23 DIAGNOSIS — Z6822 Body mass index (BMI) 22.0-22.9, adult: Secondary | ICD-10-CM | POA: Diagnosis not present

## 2020-12-26 DIAGNOSIS — L821 Other seborrheic keratosis: Secondary | ICD-10-CM | POA: Diagnosis not present

## 2020-12-26 IMAGING — US US ABDOMEN COMPLETE
1 series · 14 of 25 positions shown · non-contrast
Comparison: None.

CLINICAL DATA: Right upper quadrant pain

EXAM:
ABDOMEN ULTRASOUND COMPLETE

[Series 1: us abdomen complete · 14 of 121 slices shown]
[im 1/121]
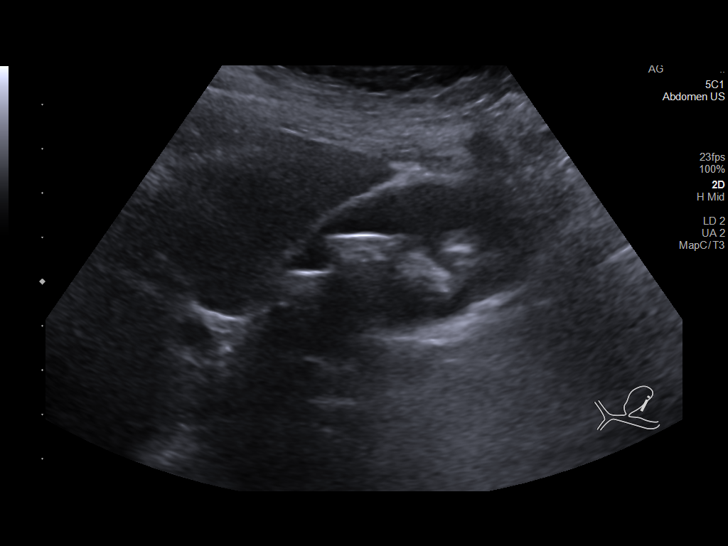
[im 11/121]
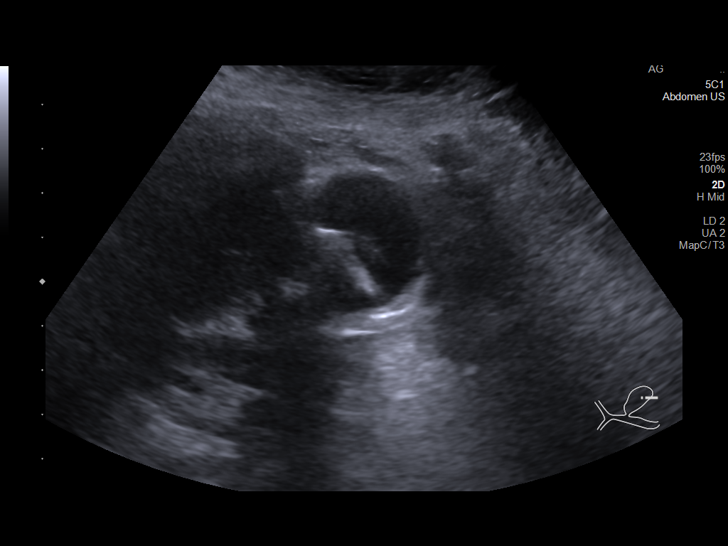
[im 21/121]
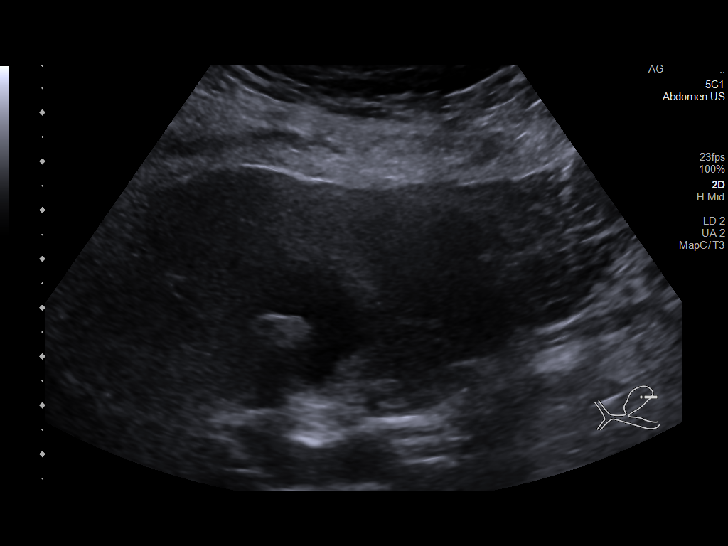
[im 31/121]
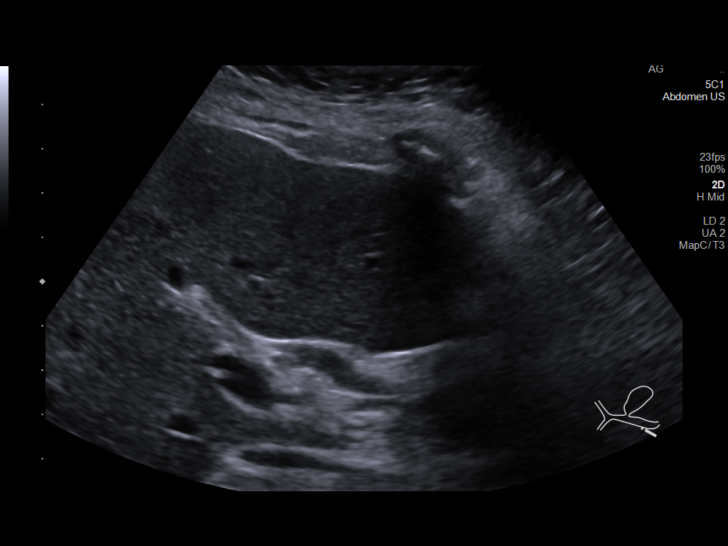
[im 41/121]
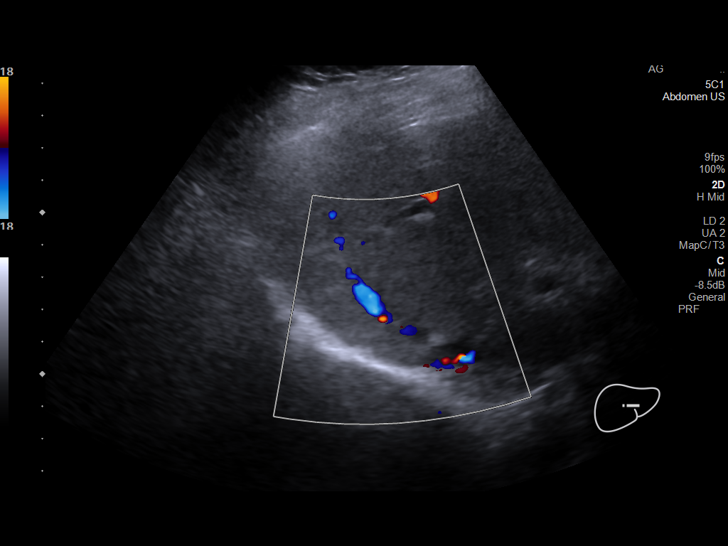
[im 46/121]
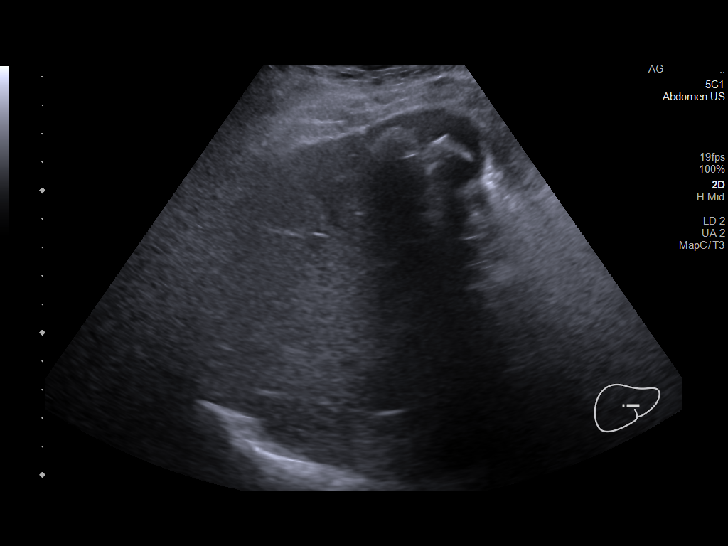
[im 56/121]
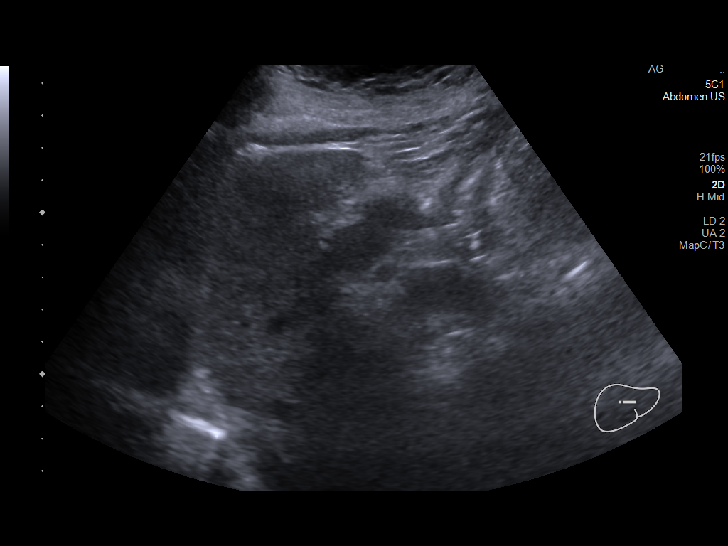
[im 66/121]
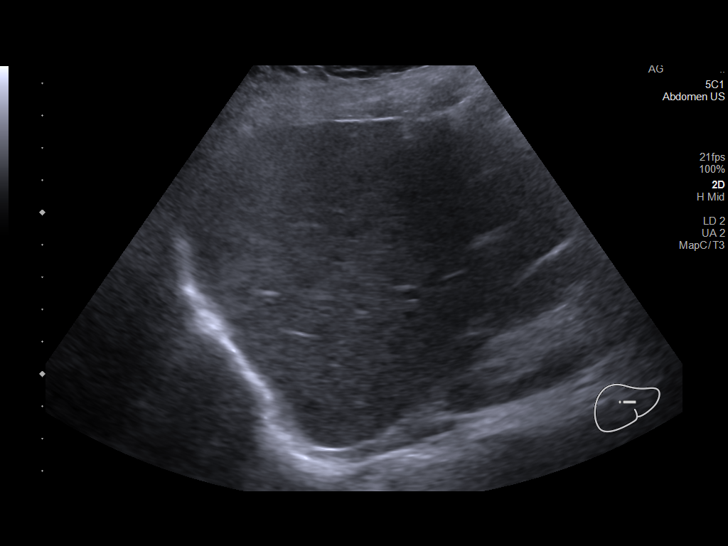
[im 76/121]
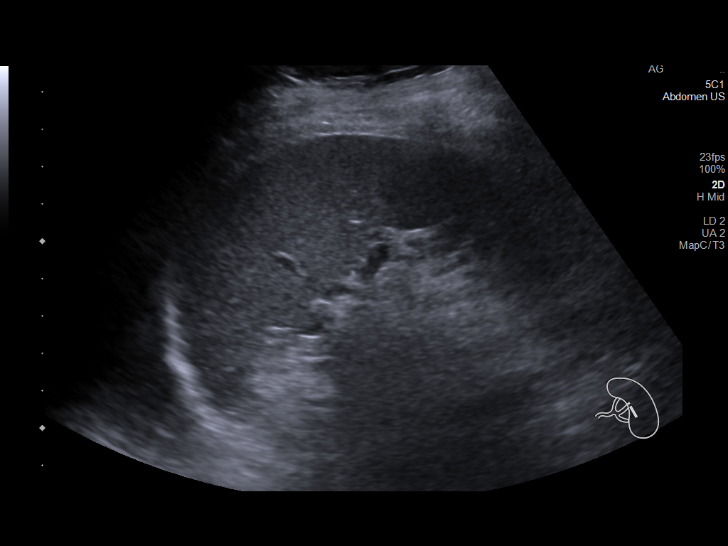
[im 81/121]
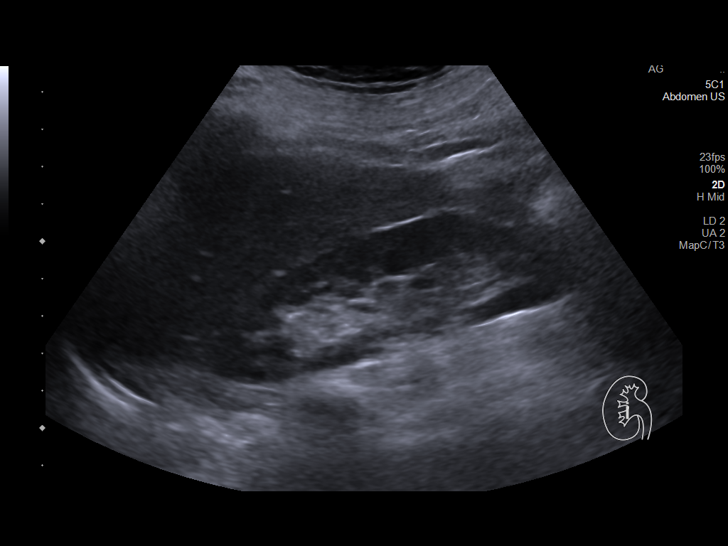
[im 91/121]
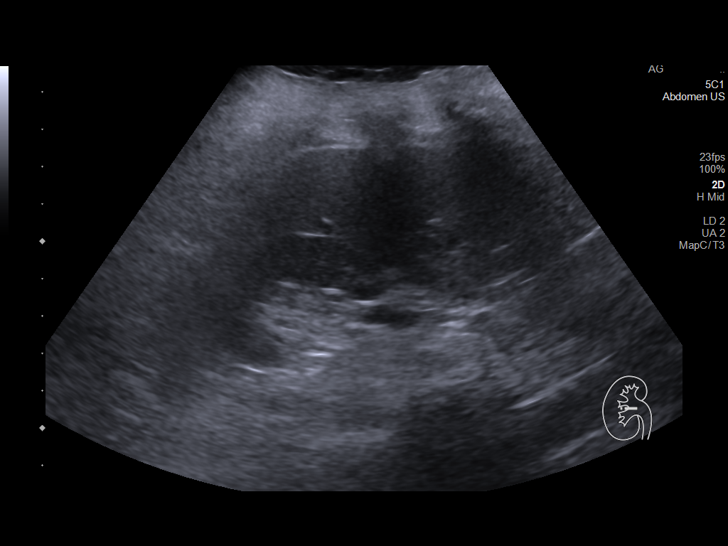
[im 101/121]
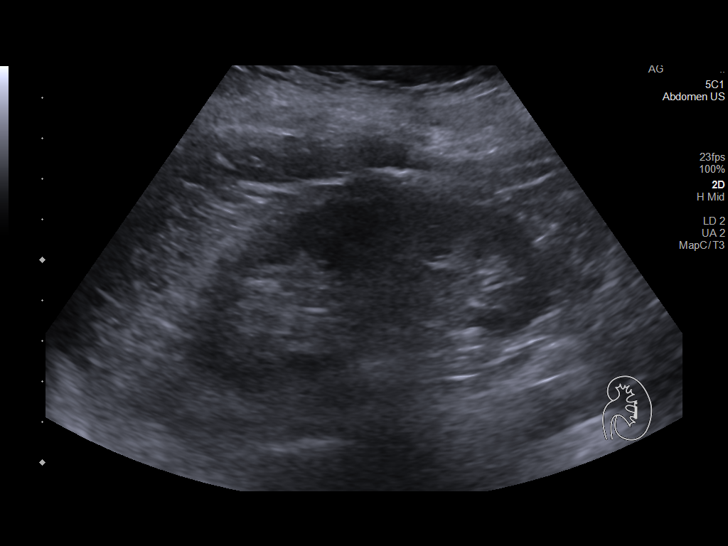
[im 111/121]
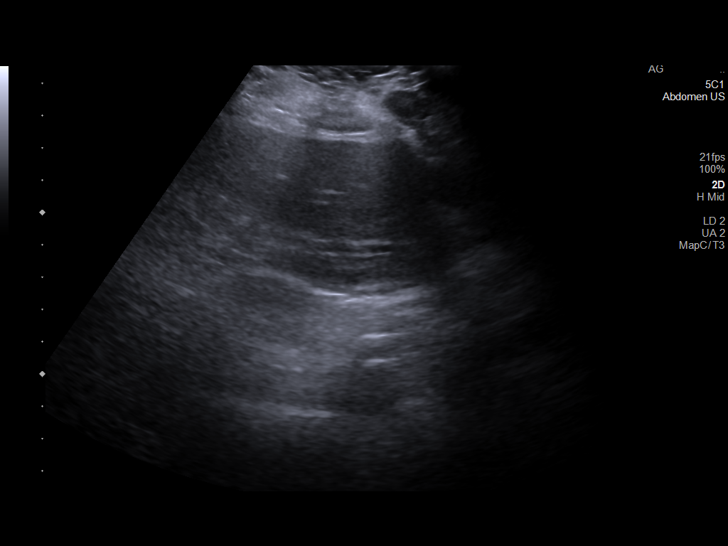
[im 121/121]
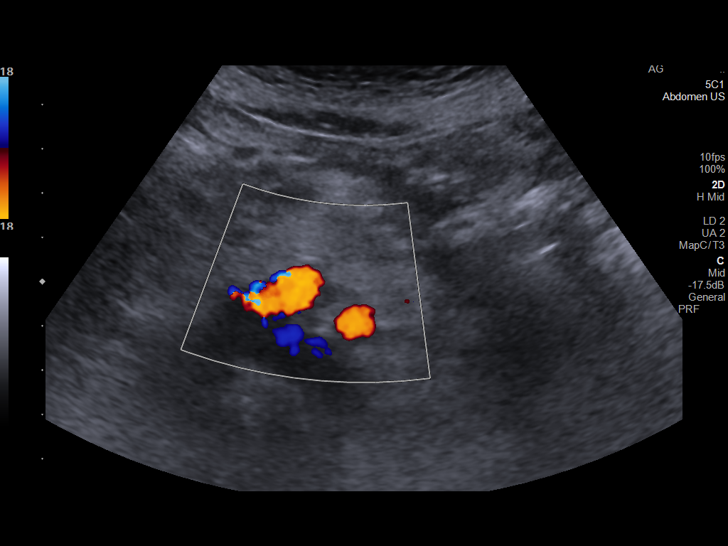

[14 of 25 positions shown; findings below may reference images not displayed]

FINDINGS: Gallbladder: Multiple gallstones measuring up to 13 mm. Negative
sonographic Murphy sign. Gallbladder wall not thickened.

Common bile duct: Diameter: 5.4 mm.

Liver: No focal lesion identified. Within normal limits in
parenchymal echogenicity. Portal vein is patent on color Doppler
imaging with normal direction of blood flow towards the liver.

IVC: No abnormality visualized.

Pancreas: Visualized portion unremarkable.

Spleen: Size and appearance within normal limits.

Right Kidney: Length: 10.2 cm. Echogenicity within normal limits. No
mass or hydronephrosis visualized.

Left Kidney: Length: 10.0 cm. Echogenicity within normal limits. No
mass or hydronephrosis visualized.

Abdominal aorta: No aneurysm visualized.

Other findings: None.
IMPRESSION: Cholelithiasis without evidence of cholecystitis or biliary
dilatation.

## 2021-05-09 DIAGNOSIS — Z1231 Encounter for screening mammogram for malignant neoplasm of breast: Secondary | ICD-10-CM | POA: Diagnosis not present

## 2021-07-02 DIAGNOSIS — E039 Hypothyroidism, unspecified: Secondary | ICD-10-CM | POA: Diagnosis not present

## 2021-07-02 DIAGNOSIS — E559 Vitamin D deficiency, unspecified: Secondary | ICD-10-CM | POA: Diagnosis not present

## 2021-07-02 DIAGNOSIS — R7989 Other specified abnormal findings of blood chemistry: Secondary | ICD-10-CM | POA: Diagnosis not present

## 2021-07-02 DIAGNOSIS — Z1329 Encounter for screening for other suspected endocrine disorder: Secondary | ICD-10-CM | POA: Diagnosis not present

## 2021-07-02 DIAGNOSIS — K219 Gastro-esophageal reflux disease without esophagitis: Secondary | ICD-10-CM | POA: Diagnosis not present

## 2021-07-02 DIAGNOSIS — Z13220 Encounter for screening for lipoid disorders: Secondary | ICD-10-CM | POA: Diagnosis not present

## 2021-07-02 DIAGNOSIS — Z1159 Encounter for screening for other viral diseases: Secondary | ICD-10-CM | POA: Diagnosis not present

## 2021-07-07 DIAGNOSIS — F411 Generalized anxiety disorder: Secondary | ICD-10-CM | POA: Diagnosis not present

## 2021-07-07 DIAGNOSIS — J4540 Moderate persistent asthma, uncomplicated: Secondary | ICD-10-CM | POA: Diagnosis not present

## 2021-07-07 DIAGNOSIS — E559 Vitamin D deficiency, unspecified: Secondary | ICD-10-CM | POA: Diagnosis not present

## 2021-07-07 DIAGNOSIS — K635 Polyp of colon: Secondary | ICD-10-CM | POA: Diagnosis not present

## 2021-07-07 DIAGNOSIS — Z0001 Encounter for general adult medical examination with abnormal findings: Secondary | ICD-10-CM | POA: Diagnosis not present

## 2021-07-07 DIAGNOSIS — R7989 Other specified abnormal findings of blood chemistry: Secondary | ICD-10-CM | POA: Diagnosis not present

## 2021-07-07 DIAGNOSIS — K5730 Diverticulosis of large intestine without perforation or abscess without bleeding: Secondary | ICD-10-CM | POA: Diagnosis not present

## 2021-07-07 DIAGNOSIS — I10 Essential (primary) hypertension: Secondary | ICD-10-CM | POA: Diagnosis not present

## 2021-08-14 DIAGNOSIS — S70361A Insect bite (nonvenomous), right thigh, initial encounter: Secondary | ICD-10-CM | POA: Diagnosis not present

## 2021-08-14 DIAGNOSIS — W57XXXA Bitten or stung by nonvenomous insect and other nonvenomous arthropods, initial encounter: Secondary | ICD-10-CM | POA: Diagnosis not present

## 2021-09-10 DIAGNOSIS — S90522A Blister (nonthermal), left ankle, initial encounter: Secondary | ICD-10-CM | POA: Diagnosis not present

## 2021-09-22 ENCOUNTER — Encounter (INDEPENDENT_AMBULATORY_CARE_PROVIDER_SITE_OTHER): Payer: Self-pay | Admitting: Gastroenterology

## 2021-09-22 ENCOUNTER — Ambulatory Visit (INDEPENDENT_AMBULATORY_CARE_PROVIDER_SITE_OTHER): Payer: PPO | Admitting: Gastroenterology

## 2021-09-22 DIAGNOSIS — K529 Noninfective gastroenteritis and colitis, unspecified: Secondary | ICD-10-CM

## 2021-09-22 DIAGNOSIS — R0609 Other forms of dyspnea: Secondary | ICD-10-CM | POA: Diagnosis not present

## 2021-09-22 DIAGNOSIS — I10 Essential (primary) hypertension: Secondary | ICD-10-CM | POA: Diagnosis not present

## 2021-09-22 DIAGNOSIS — E559 Vitamin D deficiency, unspecified: Secondary | ICD-10-CM | POA: Diagnosis not present

## 2021-09-22 NOTE — Progress Notes (Signed)
Maylon Peppers, M.D. Gastroenterology & Hepatology [facility] [address] [place], [place] [address]  Primary Care Physician: Curlene Labrum, MD [place] [address]  I will communicate my assessment and recommendations to the referring MD via EMR.  Problems: GERD  History of Present Illness: Jean Rivas is a 71 y.o. female with PMH ashtma, anxiety, GERD, who presents for evaluation of diarrhea.  The patient was last seen on 11/23/2019. At that time, the patient was advised to decrease pantoprazole to 20 mg every day for right upper quadrant pain and GERD.  She underwent an ultrasound of the right upper quadrant which was positive for gallstones.  She was referred to general surgery but the patient wanted to hold off on the referral as she had multiple issues in her life.  There was also concern about her weight loss as she lost 13 pounds in 1.5 years.  Her last weight in our clinic was 137 pounds, today 135  She reports a chronic history of diarrhea since her 40s. She states that she has noticed with time the stool has been more watery when she has "bouts of diarrhea". She states she had long periods of time when she had normal stools but has been concerned as for the last 2 months she had persiostent diarrhea, for which she has had to take loperamide every 3 day or so. Diarreha may worsen especially if she gets upset for some reason.  She has 2-3 episodes of very loose Bms when she has diarrhea but she takes the loperamide and it slows down the Bms for 2 days. She may have some nausea but no vomiting. The patient denies having any fever, chills, hematochezia, melena, hematemesis, abdominal distention, abdominal pain,  jaundice, pruritus or weight loss.  Has been avoiding fried food and only takes famotidine twice a day. Last episode of heartburn was a 1 month ago.  Last TSH 1 year ago was 0.85.  Last EGD: Last  Colonoscopy: 03/2018 - One small polyp at the splenic flexure. Biopsied - hyperplastic. - Diverticulosis in the mid sigmoid colon. - External hemorrhoids.  Recommended repeat colonoscopy in 10 years  Past Medical History: Past Medical History:  Diagnosis Date   Anxiety    Asthma    GERD (gastroesophageal reflux disease)    Heart palpitations    LBBB (left bundle branch block)    Mildly reduced LVEF ~45-50%, Gr 1 DD, Mild MR & TR; 2010 Myoivew read as "mild" aical ischemia - LOW RISK (not acted upon)   RAD (reactive airway disease)     exacerbated by allergies   Seasonal allergies     Past Surgical History: Past Surgical History:  Procedure Laterality Date   2D Echocardiogram  08/28/2009   EF 45-50%, impaired (stage1) diastolic dysfunction, mild regurg of the mitral and tricuspid valves   BIOPSY  03/23/2018   Procedure: BIOPSY;  Surgeon: Rogene Houston, MD;  [facility];  Service: Endoscopy;;  splenic flexure polyp   COLONOSCOPY N/A 03/23/2018   Procedure: COLONOSCOPY;  Surgeon: Rogene Houston, MD;  [facility];  Service: Endoscopy;  Laterality: N/A;  1:00   EYE SURGERY Bilateral    cataract   Lexiscan Myoview  03/26/2008   Mild ischemia in apical regions, post-stress EF 67%, nondiagnostic EKG, low-risk abnormal study    Family History: Family History  Problem Relation Age of Onset   Lung disease Father  Contracted in [place]   Diabetes Mother     Social History: Social History   Tobacco Use  Smoking Status Never  Smokeless Tobacco Never   Social History   Substance and Sexual Activity  Alcohol Use Yes   Comment: very seldom   Social History   Substance and Sexual Activity  Drug Use No    Allergies: Allergies  Allergen Reactions   Penicillins Itching    Did it involve swelling of the face/tongue/throat, SOB, or low BP? No Did it involve sudden or severe rash/hives, skin peeling, or any reaction on the inside of your mouth or  nose? No Did you need to seek medical attention at a hospital or doctor's office? No When did it last happen?      10+ years If all above answers are "NO", may proceed with cephalosporin use.     Medications: Current Outpatient Medications  Medication Sig Dispense Refill   aspirin EC 81 MG tablet Take 81 mg by mouth daily as needed.     diphenhydrAMINE (BENADRYL) 25 MG tablet Take 25 mg by mouth at bedtime as needed for allergies.     famotidine (PEPCID) 20 MG tablet Take 20 mg by mouth 2 (two) times daily.     fluticasone (FLONASE) 50 MCG/ACT nasal spray Place 1 spray into both nostrils daily as needed for allergies or rhinitis.     losartan (COZAAR) 25 MG tablet Take 1 tablet (25 mg total) by mouth daily. 90 tablet 2   metoprolol tartrate (LOPRESSOR) 25 MG tablet Take 1 tablet (25 mg total) by mouth 2 (two) times daily. 180 tablet 3   tiotropium (SPIRIVA) 18 MCG inhalation capsule Place 1 capsule into inhaler and inhale daily.     triamcinolone cream (KENALOG) 0.1 % Apply 1 application topically daily as needed (eczema).     ADVAIR DISKUS 250-50 MCG/DOSE AEPB Inhale 1 puff into the lungs every evening.  (Patient not taking: Reported on 09/22/2021)     albuterol (PROVENTIL HFA;VENTOLIN HFA) 108 (90 BASE) MCG/ACT inhaler Inhale 1-2 puffs into the lungs every 6 (six) hours as needed for wheezing or shortness of breath. (Patient not taking: Reported on 09/22/2021)     No current facility-administered medications for this visit.    Review of Systems: GENERAL: negative for malaise, night sweats HEENT: No changes in hearing or vision, no nose bleeds or other nasal problems. NECK: Negative for lumps, goiter, pain and significant neck swelling RESPIRATORY: Negative for cough, wheezing CARDIOVASCULAR: Negative for chest pain, leg swelling, palpitations, orthopnea GI: SEE HPI MUSCULOSKELETAL: Negative for joint pain or swelling, back pain, and muscle pain. SKIN: Negative for lesions, rash PSYCH:  Negative for sleep disturbance, mood disorder and recent psychosocial stressors. HEMATOLOGY Negative for prolonged bleeding, bruising easily, and swollen nodes. ENDOCRINE: Negative for cold or heat intolerance, polyuria, polydipsia and goiter. NEURO: negative for tremor, gait imbalance, syncope and seizures. The remainder of the review of systems is noncontributory.   Physical Exam: BP (!) 154/83 (BP Location: Left Arm, Patient Position: Sitting, Cuff Size: Small)   Pulse 71   Temp 98.1 F (36.7 C) (Oral)   Ht '5\' 8"'$  (1.727 m)   Wt 135 lb 12.8 oz (61.6 kg)   BMI 20.65 kg/m  GENERAL: The patient is AO x3, in no acute distress. HEENT: Head is normocephalic and atraumatic. EOMI are intact. Mouth is well hydrated and without lesions. NECK: Supple. No masses LUNGS: Clear to auscultation. No presence of rhonchi/wheezing/rales. Adequate chest expansion HEART: RRR, normal  s1 and s2. ABDOMEN: Soft, nontender, no guarding, no peritoneal signs, and nondistended. BS +. No masses. EXTREMITIES: Without any cyanosis, clubbing, rash, lesions or edema. NEUROLOGIC: AOx3, no focal motor deficit. SKIN: no jaundice, no rashes  Imaging/Labs: as above  I personally reviewed and interpreted the available labs, imaging and endoscopic files.  Impression and Plan: Jean Rivas is a 71 y.o. female with PMH ashtma, anxiety, GERD, who presents for evaluation of diarrhea.  The patient has presented intermittent episodes of diarrhea for multiple decades but has had exacerbation of her symptoms recently without red flag signs, which has partially responded to the use of loperamide.  It is likely her symptoms are related to a functional etiology (IBS-D) given the chronicity of her symptoms, we will evaluate for reversible etiologies with blood work-up and stool testing.  She can continue taking loperamide as needed.  We discussed the possibility of trying Xifaxan in the future for IBS-D but she does not seem to be  interested in this for now.  We also discussed the fact that even though she had cholelithiasis, she has not been symptomatic and her symptoms have actually improved after avoiding greasy foods.  We will continue monitor her clinically but if she does not have any more pain or nausea/vomiting episodes, no further action is warranted.  -Continue loperamide as needed for diarrhea episodes -Check celiac disease panel, TSH and CMP - Check  GI path, fecal fat and elastase in stool  All questions were answered.      Harvel Quale, MD Gastroenterology and Hepatology [facility]

## 2021-09-22 NOTE — Patient Instructions (Signed)
Continue loperamide as needed for diarrhea episodes Perform blood workup Perform stool workup

## 2021-09-23 LAB — COMPREHENSIVE METABOLIC PANEL
AG Ratio: 1.7 (calc) (ref 1.0–2.5)
ALT: 8 U/L (ref 6–29)
AST: 14 U/L (ref 10–35)
Albumin: 4.3 g/dL (ref 3.6–5.1)
Alkaline phosphatase (APISO): 44 U/L (ref 37–153)
BUN: 15 mg/dL (ref 7–25)
CO2: 28 mmol/L (ref 20–32)
Calcium: 9.7 mg/dL (ref 8.6–10.4)
Chloride: 106 mmol/L (ref 98–110)
Creat: 0.86 mg/dL (ref 0.60–1.00)
Globulin: 2.5 g/dL (calc) (ref 1.9–3.7)
Glucose, Bld: 101 mg/dL — ABNORMAL HIGH (ref 65–99)
Potassium: 4 mmol/L (ref 3.5–5.3)
Sodium: 143 mmol/L (ref 135–146)
Total Bilirubin: 1.6 mg/dL — ABNORMAL HIGH (ref 0.2–1.2)
Total Protein: 6.8 g/dL (ref 6.1–8.1)

## 2021-09-23 LAB — CELIAC DISEASE PANEL
(tTG) Ab, IgA: <1 U/mL
(tTG) Ab, IgG: <1 U/mL
Gliadin IgA: <1 U/mL
Gliadin IgG: <1 U/mL
Immunoglobulin A: 192 mg/dL (ref 70–320)

## 2021-09-23 LAB — TSH: TSH: 1.12 mIU/L (ref 0.40–4.50)

## 2021-09-30 ENCOUNTER — Encounter (INDEPENDENT_AMBULATORY_CARE_PROVIDER_SITE_OTHER): Payer: Self-pay

## 2021-09-30 DIAGNOSIS — E559 Vitamin D deficiency, unspecified: Secondary | ICD-10-CM | POA: Insufficient documentation

## 2021-10-28 ENCOUNTER — Encounter (INDEPENDENT_AMBULATORY_CARE_PROVIDER_SITE_OTHER): Payer: Self-pay

## 2021-11-15 ENCOUNTER — Other Ambulatory Visit: Payer: Self-pay | Admitting: Cardiology

## 2021-12-09 DIAGNOSIS — R231 Pallor: Secondary | ICD-10-CM | POA: Diagnosis not present

## 2021-12-09 DIAGNOSIS — Z880 Allergy status to penicillin: Secondary | ICD-10-CM | POA: Diagnosis not present

## 2021-12-09 DIAGNOSIS — H4720 Unspecified optic atrophy: Secondary | ICD-10-CM | POA: Diagnosis not present

## 2021-12-09 DIAGNOSIS — H25043 Posterior subcapsular polar age-related cataract, bilateral: Secondary | ICD-10-CM | POA: Diagnosis not present

## 2021-12-09 DIAGNOSIS — Z79899 Other long term (current) drug therapy: Secondary | ICD-10-CM | POA: Diagnosis not present

## 2021-12-09 DIAGNOSIS — H43812 Vitreous degeneration, left eye: Secondary | ICD-10-CM | POA: Diagnosis not present

## 2021-12-09 DIAGNOSIS — H43813 Vitreous degeneration, bilateral: Secondary | ICD-10-CM | POA: Diagnosis not present

## 2021-12-22 ENCOUNTER — Other Ambulatory Visit: Payer: Self-pay | Admitting: Cardiology

## 2021-12-23 DIAGNOSIS — M8589 Other specified disorders of bone density and structure, multiple sites: Secondary | ICD-10-CM | POA: Diagnosis not present

## 2021-12-23 DIAGNOSIS — M810 Age-related osteoporosis without current pathological fracture: Secondary | ICD-10-CM | POA: Diagnosis not present

## 2021-12-28 ENCOUNTER — Encounter (INDEPENDENT_AMBULATORY_CARE_PROVIDER_SITE_OTHER): Payer: Self-pay | Admitting: Gastroenterology

## 2022-01-06 ENCOUNTER — Encounter (INDEPENDENT_AMBULATORY_CARE_PROVIDER_SITE_OTHER): Payer: Self-pay | Admitting: Gastroenterology

## 2022-01-22 ENCOUNTER — Ambulatory Visit (INDEPENDENT_AMBULATORY_CARE_PROVIDER_SITE_OTHER): Payer: PPO | Admitting: Gastroenterology

## 2022-02-02 DIAGNOSIS — R7989 Other specified abnormal findings of blood chemistry: Secondary | ICD-10-CM | POA: Diagnosis not present

## 2022-02-02 DIAGNOSIS — Z13220 Encounter for screening for lipoid disorders: Secondary | ICD-10-CM | POA: Diagnosis not present

## 2022-02-02 DIAGNOSIS — R5383 Other fatigue: Secondary | ICD-10-CM | POA: Diagnosis not present

## 2022-02-02 DIAGNOSIS — K219 Gastro-esophageal reflux disease without esophagitis: Secondary | ICD-10-CM | POA: Diagnosis not present

## 2022-02-02 DIAGNOSIS — E559 Vitamin D deficiency, unspecified: Secondary | ICD-10-CM | POA: Diagnosis not present

## 2022-02-05 DIAGNOSIS — E559 Vitamin D deficiency, unspecified: Secondary | ICD-10-CM | POA: Diagnosis not present

## 2022-02-05 DIAGNOSIS — J4540 Moderate persistent asthma, uncomplicated: Secondary | ICD-10-CM | POA: Diagnosis not present

## 2022-02-05 DIAGNOSIS — F411 Generalized anxiety disorder: Secondary | ICD-10-CM | POA: Diagnosis not present

## 2022-02-05 DIAGNOSIS — M8580 Other specified disorders of bone density and structure, unspecified site: Secondary | ICD-10-CM | POA: Diagnosis not present

## 2022-02-05 DIAGNOSIS — Z6822 Body mass index (BMI) 22.0-22.9, adult: Secondary | ICD-10-CM | POA: Diagnosis not present

## 2022-02-05 DIAGNOSIS — Z23 Encounter for immunization: Secondary | ICD-10-CM | POA: Diagnosis not present

## 2022-02-05 DIAGNOSIS — R030 Elevated blood-pressure reading, without diagnosis of hypertension: Secondary | ICD-10-CM | POA: Diagnosis not present

## 2022-02-05 DIAGNOSIS — J302 Other seasonal allergic rhinitis: Secondary | ICD-10-CM | POA: Diagnosis not present

## 2022-03-12 ENCOUNTER — Encounter: Payer: Self-pay | Admitting: Cardiology

## 2022-03-12 NOTE — Telephone Encounter (Signed)
Can we clarify taking lopressor '25mg'$  twice daily and not just once daily. If so can rewrite Rx as lopressor '25mg'$  bid, may take additional 12.'5mg'$  prn palpitations  J Kandis Henry MD

## 2022-03-13 ENCOUNTER — Other Ambulatory Visit: Payer: Self-pay | Admitting: *Deleted

## 2022-03-13 MED ORDER — METOPROLOL TARTRATE 25 MG PO TABS: 25.0000 mg | ORAL_TABLET | Freq: Two times a day (BID) | ORAL | 1 refills | Status: DC

## 2022-03-23 ENCOUNTER — Ambulatory Visit (INDEPENDENT_AMBULATORY_CARE_PROVIDER_SITE_OTHER): Payer: PPO | Admitting: Gastroenterology

## 2022-03-23 ENCOUNTER — Encounter (INDEPENDENT_AMBULATORY_CARE_PROVIDER_SITE_OTHER): Payer: Self-pay | Admitting: Gastroenterology

## 2022-03-23 VITALS — BP 123/64 | HR 80 | Temp 98.5°F | Ht 68.0 in | Wt 139.3 lb

## 2022-03-23 DIAGNOSIS — K219 Gastro-esophageal reflux disease without esophagitis: Secondary | ICD-10-CM

## 2022-03-23 DIAGNOSIS — K529 Noninfective gastroenteritis and colitis, unspecified: Secondary | ICD-10-CM

## 2022-03-23 MED ORDER — PANTOPRAZOLE SODIUM 40 MG PO TBEC: 40.0000 mg | DELAYED_RELEASE_TABLET | Freq: Every day | ORAL | 3 refills | Status: DC

## 2022-03-23 NOTE — Progress Notes (Addendum)
Referring Provider: Curlene Labrum, MD Primary Care Physician:  Curlene Labrum, MD Primary GI Physician: Jenetta Downer   Chief Complaint  Patient presents with   Irritable Bowel Syndrome    Follow up on IBS and GERD. Concerns about diarrhea. Takes loperamide for diarrhea and it helps.    HPI:   Jean Rivas is a 72 y.o. female with past medical history of asthma, anxiety, GERD, chronic diarrhea   Patient presenting today for follow up of GERD and chronic diarrhea   Last seen August 2023, reporting chronic history of diarrhea since her 4s.noticed with time the stool has been more watery. long periods of time when she had normal stools but diarrhea for the past 2 months. Taking loperamide every 3 days or so. Diarreha may worsen especially if she gets upset for some reason.  She has 2-3 episodes of very loose BMs when she has diarrhea but she takes the loperamide and it slows down the Bms for 2 days. She may have some nausea but no vomiting. Taking famotidine BID with good results.   Recommended to continue loperamide PRN, check Celiac, TSH, CMP, GI pathogen panel, fecal fat/elastase  TSH 1.12, CMP, Celiac normal. Stool studies were not completed.  Present:  Patient states that she goes through bouts of diarrhea, may have numerous episodes of diarrhea, she will take loperamide and then may not have a BM for 1-2 days after that, then may have some more solid stools. She is taking this once every 2-3 days. Cannot pinpoint any specific food triggers. Denies any abdominal pain. Denies blood in stool or melena. She does note that during times of higher stress, symptoms seems to be worse.  No changes in appetite or weight loss. She notes that she did not do stool testing last time but is willing to do this now. She has tried avoiding dairy and meat which do not seem to affect her symptoms very much at all. Has done benefiber in the past but is not sure she noticed much difference.   GERD  relatively well managed on famotidine '20mg'$  BID. She has another acid reflux medication that she was given previously (protonix?) she takes this only when symptoms flare up. Has maybe 2 episodes per week with heartburn or acid regurgitation. Denies dysphagia or odynophagia, notes some issues with this previously, especially with thinner liquids like chicken broth but no issues currently.    Last EGD: Last Colonoscopy: 03/2018 - One small polyp at the splenic flexure. Biopsied - hyperplastic. - Diverticulosis in the mid sigmoid colon. - External hemorrhoids.   Recommended repeat colonoscopy in 10 years   Past Medical History:  Diagnosis Date   Anxiety    Asthma    GERD (gastroesophageal reflux disease)    Heart palpitations    LBBB (left bundle branch block)    Mildly reduced LVEF ~45-50%, Gr 1 DD, Mild MR & TR; 2010 Myoivew read as "mild" aical ischemia - LOW RISK (not acted upon)   RAD (reactive airway disease)     exacerbated by allergies   Seasonal allergies     Past Surgical History:  Procedure Laterality Date   2D Echocardiogram  08/28/2009   EF 45-50%, impaired (stage1) diastolic dysfunction, mild regurg of the mitral and tricuspid valves   BIOPSY  03/23/2018   Procedure: BIOPSY;  Surgeon: Rogene Houston, MD;  [facility];  Service: Endoscopy;;  splenic flexure polyp   COLONOSCOPY N/A 03/23/2018   Procedure: COLONOSCOPY;  Surgeon:  Rogene Houston, MD;  [facility];  Service: Endoscopy;  Laterality: N/A;  1:00   EYE SURGERY Bilateral    cataract   Lexiscan Myoview  03/26/2008   Mild ischemia in apical regions, post-stress EF 67%, nondiagnostic EKG, low-risk abnormal study    Current Outpatient Medications  Medication Sig Dispense Refill   albuterol (PROVENTIL HFA;VENTOLIN HFA) 108 (90 BASE) MCG/ACT inhaler Inhale 1-2 puffs into the lungs every 6 (six) hours as needed for wheezing or shortness of breath.     aspirin EC 81 MG tablet Take 81 mg by mouth  daily as needed.     diphenhydrAMINE (BENADRYL) 25 MG tablet Take 25 mg by mouth at bedtime as needed for allergies.     famotidine (PEPCID) 20 MG tablet Take 20 mg by mouth 2 (two) times daily.     fluticasone (FLONASE) 50 MCG/ACT nasal spray Place 1 spray into both nostrils daily as needed for allergies or rhinitis.     losartan (COZAAR) 25 MG tablet TAKE 1 TABLET BY MOUTH EVERY DAY 90 tablet 0   metoprolol tartrate (LOPRESSOR) 25 MG tablet Take 1 tablet (25 mg total) by mouth 2 (two) times daily. (May take an additional 12.'5mg'$  (1/2 tab) as needed for palpitations.) 270 tablet 1   tiotropium (SPIRIVA) 18 MCG inhalation capsule Place 1 capsule into inhaler and inhale daily.     triamcinolone cream (KENALOG) 0.1 % Apply 1 application topically daily as needed (eczema).     No current facility-administered medications for this visit.    Allergies as of 03/23/2022 - Review Complete 03/23/2022  Allergen Reaction Noted   Penicillins Itching 11/01/2012    Family History  Problem Relation Age of Onset   Lung disease Father        Contracted in [place]   Diabetes Mother     Social History   Socioeconomic History   Marital status: Married    Spouse name: Not on file   Number of children: Not on file   Years of education: Not on file   Highest education level: Not on file  Occupational History   Not on file  Tobacco Use   Smoking status: Never    Passive exposure: Past   Smokeless tobacco: Never  Vaping Use   Vaping Use: Never used  Substance and Sexual Activity   Alcohol use: Yes    Comment: very seldom   Drug use: No   Sexual activity: Not on file  Other Topics Concern   Not on file  Social History Narrative   Not on file   Social Determinants of Health   Financial Resource Strain: Not on file  Food Insecurity: Not on file  Transportation Needs: Not on file  Physical Activity: Not on file  Stress: Not on file  Social Connections: Not on file   Review of  systems General: negative for malaise, night sweats, fever, chills, weight loss Neck: Negative for lumps, goiter, pain and significant neck swelling Resp: Negative for cough, wheezing, dyspnea at rest CV: Negative for chest pain, leg swelling, palpitations, orthopnea GI: denies melena, hematochezia, nausea, vomiting, constipation, dysphagia, odyonophagia, early satiety or unintentional weight loss. +diarrhea +GERD symptoms  MSK: Negative for joint pain or swelling, back pain, and muscle pain. Derm: Negative for itching or rash Psych: Denies depression, anxiety, memory loss, confusion. No homicidal or suicidal ideation.  Heme: Negative for prolonged bleeding, bruising easily, and swollen nodes. Endocrine: Negative for cold or heat intolerance, polyuria, polydipsia and goiter. Neuro:  negative for tremor, gait imbalance, syncope and seizures. The remainder of the review of systems is noncontributory.  Physical Exam: BP 123/64 (BP Location: Left Arm, Patient Position: Sitting, Cuff Size: Large)   Pulse 80   Temp 98.5 F (36.9 C) (Oral)   Ht '5\' 8"'$  (1.727 m)   Wt 139 lb 4.8 oz (63.2 kg)   BMI 21.18 kg/m  General:   Alert and oriented. No distress noted. Pleasant and cooperative.  Head:  Normocephalic and atraumatic. Eyes:  Conjuctiva clear without scleral icterus. Mouth:  Oral mucosa pink and moist. Good dentition. No lesions. Heart: Normal rate and rhythm, s1 and s2 heart sounds present.  Lungs: Clear lung sounds in all lobes. Respirations equal and unlabored. Abdomen:  +BS, soft, non-tender and non-distended. No rebound or guarding. No HSM or masses noted. Derm: No palmar erythema or jaundice Msk:  Symmetrical without gross deformities. Normal posture. Extremities:  Without edema. Neurologic:  Alert and  oriented x4 Psych:  Alert and cooperative. Normal mood and affect.  Invalid input(s): "6 MONTHS"   ASSESSMENT: Jean Rivas is a 72 y.o. female presenting today for follow up  of Diarrhea and GERD.  Diarrhea/Suspected IBS: diarrhea for  many years, worse with times of higher stress. Takes loperamide with good results. Did not complete stool studies last time. Celiac and tsh normal.  No rectal bleeding, melena or weight loss. Will check fecal fat and pancreatic elastase, will hold off on stool studies to rule out infectious etiology given chronicity of symptoms. Encouraged to start look at low FODMAP food guide and keep food/stool journal x2 weeks to evaluate for any specific food triggers. Stress management is also important. Can try starting benefiber 1T BID with meals to help thicken up stools and may benefit from daily probiotic or activia yogurt. Can use loperamide PRN  GERD: having symptoms twice a week on famotidine BID. Takes protonix on occasion, nothing she read PPIs could increase gallstones so she was hesitant to take this more often. Patient was re-assured regarding PPI use and safety of when appropriate indications in question. Most recent studies on PPI therapy that association of symptoms is not equivalent to causation and overall association is weak and based on observational studies. When PPI use is indicated, it is safe to proceed with therapy and titrate dosing/use based on symptom response. Would recommend stopping H2B and start protonix once daily. Should also adhere to reflux precautions to include avoiding greasy, spicy, fried, citrus foods, and be mindful that caffeine, carbonated drinks, chocolate and alcohol can increase reflux symptoms.stay upright 2-3 hours after eating, prior to lying down and avoid eating late in the evenings.   PLAN:  Complete stool testing  2. Continue loperamide PRN  3. Low FODMAP diet 4. Good stress management  5. Stop famotidine, restart protonix '40mg'$  daily 6. Good water intake 7. Benefiber 1T BID with meals   All questions were answered, patient verbalized understanding and is in agreement with plan as outlined above.     Follow Up: 6 months   Dawnmarie Breon L. Alver Sorrow, MSN, APRN, AGNP-C Adult-Gerontology Nurse Practitioner [facility]  I have reviewed the note and agree with the APP's assessment as described in this progress note  May consider possible Xifaxan trial if persistent symptoms and unremarkable workup  Maylon Peppers, MD Gastroenterology and [facility]

## 2022-03-23 NOTE — Patient Instructions (Signed)
Please Complete stool testing  You can use loperamide as needed  I am providing the low fodmap diet and recommend keeping a stool/food journal x2 weeks to look for specific food triggers, stress management is also important Stop famotidine, restart protonix '40mg'$  daily Make sure water intake is good, you can try adding in Benefiber 1T twice daily with meals to help thicken up stools Activia or another daily probiotic may also be helpful in maintaining good bacteria in the gut  Follow up 6 months

## 2022-04-28 ENCOUNTER — Ambulatory Visit: Payer: PPO | Attending: Cardiology | Admitting: Cardiology

## 2022-04-28 ENCOUNTER — Encounter: Payer: Self-pay | Admitting: Cardiology

## 2022-04-28 VITALS — BP 108/64 | HR 76 | Ht 68.0 in | Wt 142.4 lb

## 2022-04-28 DIAGNOSIS — E785 Hyperlipidemia, unspecified: Secondary | ICD-10-CM

## 2022-04-28 DIAGNOSIS — I447 Left bundle-branch block, unspecified: Secondary | ICD-10-CM | POA: Diagnosis not present

## 2022-04-28 DIAGNOSIS — R002 Palpitations: Secondary | ICD-10-CM | POA: Diagnosis not present

## 2022-04-28 DIAGNOSIS — I10 Essential (primary) hypertension: Secondary | ICD-10-CM

## 2022-04-28 DIAGNOSIS — H539 Unspecified visual disturbance: Secondary | ICD-10-CM | POA: Diagnosis not present

## 2022-04-28 NOTE — Addendum Note (Signed)
Addended by: Laurine Blazer on: 04/28/2022 09:00 AM   Modules accepted: Orders

## 2022-04-28 NOTE — Patient Instructions (Addendum)
Medication Instructions:  Continue all current medications.   Labwork: none  Testing/Procedures: Your physician has requested that you have a carotid duplex. This test is an ultrasound of the carotid arteries in your neck. It looks at blood flow through these arteries that supply the brain with blood. Allow one hour for this exam. There are no restrictions or special instructions.  Office will contact with results via phone, letter or mychart.     Follow-Up: Your physician wants you to follow up in:  1 year.  You should receive a recall letter in the mail about 2 months prior to the time you are due.  If you don't receive this, please call our office to schedule your follow up appointment.      Any Other Special Instructions Will Be Listed Below (If Applicable).   If you need a refill on your cardiac medications before your next appointment, please call your pharmacy.

## 2022-04-28 NOTE — Progress Notes (Signed)
Clinical Summary Jean Rivas is a 72 y.o.female seen today for follow up of the following medical problems.    1. LBBB - chronic for several years. Prior stress testing and echo overall unremarkable for significant cardiac disease       2. Palpitations - noted PACs and PVCs on EKG, medically managed with beta blocker. Has not had to wear a monitor  -  Limited caffeine   - some palpitations at times, tolerable with lopressor 12.'5mg'$  tid.    3. HTN - medical therapy limited due to some prior orthostatic dizziness.  - compliant with meds       4. Reactive airway disease - compliant with inhalers - breathing improved with spiriva initially - some recent SOB with higher levels of exertion       5. Vision change - optho has asked for carotid US  -"Carotid U/S due to patient complaint of poor dark adaptation" Past Medical History:  Diagnosis Date   Anxiety    Asthma    GERD (gastroesophageal reflux disease)    Heart palpitations    LBBB (left bundle [place] block)    Mildly reduced LVEF ~45-50%, Gr 1 DD, Mild MR & TR; 2010 Myoivew read as "mild" aical ischemia - LOW RISK (not acted upon)   RAD (reactive airway disease)     exacerbated by allergies   Seasonal allergies      Allergies  Allergen Reactions   Penicillins Itching    Did it involve swelling of the face/tongue/throat, SOB, or low BP? No Did it involve sudden or severe rash/hives, skin peeling, or any reaction on the inside of your mouth or nose? No Did you need to seek medical attention at a hospital or doctor's office? No When did it last happen?      10+ years If all above answers are "NO", may proceed with cephalosporin use.      Current Outpatient Medications  Medication Sig Dispense Refill   albuterol (PROVENTIL HFA;VENTOLIN HFA) 108 (90 BASE) MCG/ACT inhaler Inhale 1-2 puffs into the lungs every 6 (six) hours as needed for wheezing or shortness of breath.     aspirin EC 81 MG tablet Take 81 mg  by mouth daily as needed.     diphenhydrAMINE (BENADRYL) 25 MG tablet Take 25 mg by mouth at bedtime as needed for allergies.     famotidine (PEPCID) 20 MG tablet Take 20 mg by mouth 2 (two) times daily.     fluticasone (FLONASE) 50 MCG/ACT nasal spray Place 1 spray into both nostrils daily as needed for allergies or rhinitis.     losartan (COZAAR) 25 MG tablet TAKE 1 TABLET BY MOUTH EVERY DAY 90 tablet 0   metoprolol tartrate (LOPRESSOR) 25 MG tablet Take 1 tablet (25 mg total) by mouth 2 (two) times daily. (May take an additional 12.'5mg'$  (1/2 tab) as needed for palpitations.) 270 tablet 1   pantoprazole (PROTONIX) 40 MG tablet Take 1 tablet (40 mg total) by mouth daily. 60 tablet 3   tiotropium (SPIRIVA) 18 MCG inhalation capsule Place 1 capsule into inhaler and inhale daily.     triamcinolone cream (KENALOG) 0.1 % Apply 1 application topically daily as needed (eczema).     No current facility-administered medications for this visit.     Past Surgical History:  Procedure Laterality Date   2D Echocardiogram  08/28/2009   EF 45-50%, impaired (stage1) diastolic dysfunction, mild regurg of the mitral and tricuspid valves   BIOPSY  03/23/2018   Procedure: BIOPSY;  Surgeon: Rogene Houston, MD;  [facility];  Service: Endoscopy;;  splenic flexure polyp   COLONOSCOPY N/A 03/23/2018   Procedure: COLONOSCOPY;  Surgeon: Rogene Houston, MD;  [facility];  Service: Endoscopy;  Laterality: N/A;  1:00   EYE SURGERY Bilateral    cataract   Lexiscan Myoview  03/26/2008   Mild ischemia in apical regions, post-stress EF 67%, nondiagnostic EKG, low-risk abnormal study     Allergies  Allergen Reactions   Penicillins Itching    Did it involve swelling of the face/tongue/throat, SOB, or low BP? No Did it involve sudden or severe rash/hives, skin peeling, or any reaction on the inside of your mouth or nose? No Did you need to seek medical attention at a hospital or doctor's office?  No When did it last happen?      10+ years If all above answers are "NO", may proceed with cephalosporin use.       Family History  Problem Relation Age of Onset   Lung disease Father        Contracted in [place]   Diabetes Mother      Social History Jean Rivas reports that she has never smoked. She has been exposed to tobacco smoke. She has never used smokeless tobacco. Jean Rivas reports current alcohol use.   Review of Systems CONSTITUTIONAL: No weight loss, fever, chills, weakness or fatigue.  HEENT: prior vision change  SKIN: No rash or itching.  CARDIOVASCULAR: per hpi RESPIRATORY: No shortness of breath, cough or sputum.  GASTROINTESTINAL: No anorexia, nausea, vomiting or diarrhea. No abdominal pain or blood.  GENITOURINARY: No burning on urination, no polyuria NEUROLOGICAL: No headache, dizziness, syncope, paralysis, ataxia, numbness or tingling in the extremities. No change in bowel or bladder control.  MUSCULOSKELETAL: No muscle, back pain, joint pain or stiffness.  LYMPHATICS: No enlarged nodes. No history of splenectomy.  PSYCHIATRIC: No history of depression or anxiety.  ENDOCRINOLOGIC: No reports of sweating, cold or heat intolerance. No polyuria or polydipsia.  Marland Kitchen   Physical Examination Today's Vitals   04/28/22 0828  BP: 108/64  Pulse: 76  Weight: 142 lb 6.4 oz (64.6 kg)  Height: '5\' 8"'$  (1.727 m)   Body mass index is 21.65 kg/m.  Gen: resting comfortably, no acute distress HEENT: no scleral icterus, pupils equal round and reactive, no palptable cervical adenopathy,  CV: RRR, no m/rg, no jvd Resp: Clear to auscultation bilaterally GI: abdomen is soft, non-tender, non-distended, normal bowel sounds, no hepatosplenomegaly MSK: extremities are warm, no edema.  Skin: warm, no rash Neuro:  no focal deficits Psych: appropriate affect   Diagnostic Studies  08/2009 echo LVEF [id number], grade I diastolic dysfunction, mild MR and TR   03/2008 Lexiscan: mild  apical ischemia, low risk.    11/2016 echo Study Conclusions   - Left ventricle: The cavity size was normal. Wall thickness was   normal. Systolic function was normal. The estimated ejection   fraction was in the range of 50% to 55%. Wall motion was normal;   there were no regional wall motion abnormalities. Doppler   parameters are consistent with abnormal left ventricular   relaxation (grade 1 diastolic dysfunction). - Aortic valve: Mildly calcified annulus. Trileaflet. - Mitral valve: There was trivial regurgitation. - Right atrium: Central venous pressure (est): 3 mm Hg. - Atrial septum: No defect or patent foramen ovale was identified. - Tricuspid valve: There was trivial regurgitation. - Pulmonary arteries: PA peak  pressure: 11 mm Hg (S). - Pericardium, extracardiac: A prominent pericardial fat pad was   present.   Impressions:   - Normal LV wall thickness with LVEF 50-55% and grade 1 diastolic   dysfunction. Mildly calcified aortic annulus. Trivial tricuspid   regurgitation.   Assessment and Plan  1. LBBB - chronic, no major underlying structural heart disease by prior testing - EKG shows NSR, chronic LBBB today - no further workup indicated   2. Palpitations -symptoms ok with lopressor 12.'5mg'$  tid, discussed further dose adjustment but she is ok for the time being. Over time if stabel on a dose could consolidate to toprol   3. HTN -bp at goal, continue current meds   4. Vision change - optho has asked for carotid US, we will arrange.   F/u 1year   Arnoldo Lenis, M.D.,

## 2022-04-30 ENCOUNTER — Encounter: Payer: Self-pay | Admitting: *Deleted

## 2022-05-19 ENCOUNTER — Ambulatory Visit: Payer: PPO | Attending: Cardiology

## 2022-05-19 DIAGNOSIS — H539 Unspecified visual disturbance: Secondary | ICD-10-CM | POA: Diagnosis not present

## 2022-05-26 ENCOUNTER — Encounter: Payer: Self-pay | Admitting: *Deleted

## 2022-06-24 ENCOUNTER — Encounter (INDEPENDENT_AMBULATORY_CARE_PROVIDER_SITE_OTHER): Payer: Self-pay | Admitting: Gastroenterology

## 2022-07-15 DIAGNOSIS — R7989 Other specified abnormal findings of blood chemistry: Secondary | ICD-10-CM | POA: Diagnosis not present

## 2022-07-15 DIAGNOSIS — R5383 Other fatigue: Secondary | ICD-10-CM | POA: Diagnosis not present

## 2022-07-15 DIAGNOSIS — E559 Vitamin D deficiency, unspecified: Secondary | ICD-10-CM | POA: Diagnosis not present

## 2022-07-15 DIAGNOSIS — Z1329 Encounter for screening for other suspected endocrine disorder: Secondary | ICD-10-CM | POA: Diagnosis not present

## 2022-07-15 DIAGNOSIS — Z13220 Encounter for screening for lipoid disorders: Secondary | ICD-10-CM | POA: Diagnosis not present

## 2022-07-15 DIAGNOSIS — K219 Gastro-esophageal reflux disease without esophagitis: Secondary | ICD-10-CM | POA: Diagnosis not present

## 2022-07-31 DIAGNOSIS — M8580 Other specified disorders of bone density and structure, unspecified site: Secondary | ICD-10-CM | POA: Diagnosis not present

## 2022-07-31 DIAGNOSIS — F411 Generalized anxiety disorder: Secondary | ICD-10-CM | POA: Diagnosis not present

## 2022-07-31 DIAGNOSIS — R17 Unspecified jaundice: Secondary | ICD-10-CM | POA: Diagnosis not present

## 2022-07-31 DIAGNOSIS — J4540 Moderate persistent asthma, uncomplicated: Secondary | ICD-10-CM | POA: Diagnosis not present

## 2022-07-31 DIAGNOSIS — R030 Elevated blood-pressure reading, without diagnosis of hypertension: Secondary | ICD-10-CM | POA: Diagnosis not present

## 2022-07-31 DIAGNOSIS — M7061 Trochanteric bursitis, right hip: Secondary | ICD-10-CM | POA: Diagnosis not present

## 2022-07-31 DIAGNOSIS — K5730 Diverticulosis of large intestine without perforation or abscess without bleeding: Secondary | ICD-10-CM | POA: Diagnosis not present

## 2022-07-31 DIAGNOSIS — E559 Vitamin D deficiency, unspecified: Secondary | ICD-10-CM | POA: Diagnosis not present

## 2022-07-31 DIAGNOSIS — Z6822 Body mass index (BMI) 22.0-22.9, adult: Secondary | ICD-10-CM | POA: Diagnosis not present

## 2022-07-31 DIAGNOSIS — Z0001 Encounter for general adult medical examination with abnormal findings: Secondary | ICD-10-CM | POA: Diagnosis not present

## 2022-07-31 DIAGNOSIS — K219 Gastro-esophageal reflux disease without esophagitis: Secondary | ICD-10-CM | POA: Diagnosis not present

## 2022-07-31 DIAGNOSIS — K580 Irritable bowel syndrome with diarrhea: Secondary | ICD-10-CM | POA: Diagnosis not present

## 2022-08-03 DIAGNOSIS — R17 Unspecified jaundice: Secondary | ICD-10-CM | POA: Diagnosis not present

## 2022-09-01 ENCOUNTER — Encounter: Payer: Self-pay | Admitting: Cardiology

## 2022-09-04 NOTE — Telephone Encounter (Signed)
Would be better to discuss with her pcp, based on there evaluation they may consider neurology evaluation. From a cardiac standpoint not much expertise we would have above her pcp and possible neurology  Dominga Ferry MD

## 2022-09-10 DIAGNOSIS — G43109 Migraine with aura, not intractable, without status migrainosus: Secondary | ICD-10-CM | POA: Diagnosis not present

## 2022-09-21 ENCOUNTER — Ambulatory Visit (INDEPENDENT_AMBULATORY_CARE_PROVIDER_SITE_OTHER): Payer: PPO | Admitting: Gastroenterology

## 2022-10-01 ENCOUNTER — Other Ambulatory Visit (INDEPENDENT_AMBULATORY_CARE_PROVIDER_SITE_OTHER): Payer: Self-pay | Admitting: Gastroenterology

## 2022-10-15 ENCOUNTER — Ambulatory Visit (INDEPENDENT_AMBULATORY_CARE_PROVIDER_SITE_OTHER): Payer: PPO | Admitting: Gastroenterology

## 2023-01-21 DIAGNOSIS — G43109 Migraine with aura, not intractable, without status migrainosus: Secondary | ICD-10-CM | POA: Diagnosis not present

## 2023-02-05 ENCOUNTER — Other Ambulatory Visit (INDEPENDENT_AMBULATORY_CARE_PROVIDER_SITE_OTHER): Payer: Self-pay | Admitting: Gastroenterology

## 2023-02-08 NOTE — Telephone Encounter (Signed)
Will need office visit for further refills.  °

## 2023-03-10 ENCOUNTER — Other Ambulatory Visit: Payer: Self-pay | Admitting: Cardiology

## 2023-03-17 ENCOUNTER — Other Ambulatory Visit: Payer: Self-pay | Admitting: *Deleted

## 2023-03-17 ENCOUNTER — Encounter: Payer: Self-pay | Admitting: *Deleted

## 2023-03-17 ENCOUNTER — Encounter: Payer: Self-pay | Admitting: Cardiology

## 2023-03-17 MED ORDER — LOSARTAN POTASSIUM 25 MG PO TABS: 25.0000 mg | ORAL_TABLET | Freq: Every day | ORAL | 0 refills | Status: DC

## 2023-04-12 ENCOUNTER — Ambulatory Visit: Payer: PPO | Admitting: Nurse Practitioner

## 2023-05-07 ENCOUNTER — Other Ambulatory Visit (INDEPENDENT_AMBULATORY_CARE_PROVIDER_SITE_OTHER): Payer: Self-pay | Admitting: Gastroenterology

## 2023-05-07 NOTE — Telephone Encounter (Signed)
 Needs office visit last seen 03/23/2022.

## 2023-06-08 ENCOUNTER — Other Ambulatory Visit (INDEPENDENT_AMBULATORY_CARE_PROVIDER_SITE_OTHER): Payer: Self-pay | Admitting: Gastroenterology

## 2023-06-08 ENCOUNTER — Other Ambulatory Visit: Payer: Self-pay | Admitting: Cardiology

## 2023-06-18 DIAGNOSIS — Z0001 Encounter for general adult medical examination with abnormal findings: Secondary | ICD-10-CM | POA: Diagnosis not present

## 2023-06-18 DIAGNOSIS — E559 Vitamin D deficiency, unspecified: Secondary | ICD-10-CM | POA: Diagnosis not present

## 2023-06-18 DIAGNOSIS — D559 Anemia due to enzyme disorder, unspecified: Secondary | ICD-10-CM | POA: Diagnosis not present

## 2023-06-18 DIAGNOSIS — Z1329 Encounter for screening for other suspected endocrine disorder: Secondary | ICD-10-CM | POA: Diagnosis not present

## 2023-06-18 DIAGNOSIS — Z13220 Encounter for screening for lipoid disorders: Secondary | ICD-10-CM | POA: Diagnosis not present

## 2023-06-23 DIAGNOSIS — E559 Vitamin D deficiency, unspecified: Secondary | ICD-10-CM | POA: Diagnosis not present

## 2023-06-23 DIAGNOSIS — M8580 Other specified disorders of bone density and structure, unspecified site: Secondary | ICD-10-CM | POA: Diagnosis not present

## 2023-06-23 DIAGNOSIS — K580 Irritable bowel syndrome with diarrhea: Secondary | ICD-10-CM | POA: Diagnosis not present

## 2023-06-23 DIAGNOSIS — Z6823 Body mass index (BMI) 23.0-23.9, adult: Secondary | ICD-10-CM | POA: Diagnosis not present

## 2023-06-23 DIAGNOSIS — Z0001 Encounter for general adult medical examination with abnormal findings: Secondary | ICD-10-CM | POA: Diagnosis not present

## 2023-06-23 DIAGNOSIS — R300 Dysuria: Secondary | ICD-10-CM | POA: Diagnosis not present

## 2023-06-23 DIAGNOSIS — J4540 Moderate persistent asthma, uncomplicated: Secondary | ICD-10-CM | POA: Diagnosis not present

## 2023-06-23 DIAGNOSIS — Z2911 Encounter for prophylactic immunotherapy for respiratory syncytial virus (RSV): Secondary | ICD-10-CM | POA: Diagnosis not present

## 2023-06-24 ENCOUNTER — Other Ambulatory Visit: Payer: Self-pay | Admitting: Cardiology

## 2023-06-28 DIAGNOSIS — Z1231 Encounter for screening mammogram for malignant neoplasm of breast: Secondary | ICD-10-CM | POA: Diagnosis not present

## 2023-07-11 ENCOUNTER — Other Ambulatory Visit (INDEPENDENT_AMBULATORY_CARE_PROVIDER_SITE_OTHER): Payer: Self-pay | Admitting: Gastroenterology

## 2023-07-13 NOTE — Telephone Encounter (Signed)
 Last seen 03/2022. Needs office visit.

## 2023-07-16 ENCOUNTER — Ambulatory Visit: Payer: PPO | Admitting: Cardiology

## 2023-07-16 ENCOUNTER — Encounter: Payer: Self-pay | Admitting: *Deleted

## 2023-07-16 ENCOUNTER — Ambulatory Visit: Payer: PPO | Attending: Cardiology | Admitting: Cardiology

## 2023-07-16 ENCOUNTER — Encounter: Payer: Self-pay | Admitting: Family Medicine

## 2023-07-16 ENCOUNTER — Encounter: Payer: Self-pay | Admitting: Cardiology

## 2023-07-16 VITALS — BP 112/65 | HR 66 | Ht 68.0 in | Wt 149.0 lb

## 2023-07-16 DIAGNOSIS — I447 Left bundle-branch block, unspecified: Secondary | ICD-10-CM

## 2023-07-16 DIAGNOSIS — R002 Palpitations: Secondary | ICD-10-CM

## 2023-07-16 DIAGNOSIS — I10 Essential (primary) hypertension: Secondary | ICD-10-CM | POA: Diagnosis not present

## 2023-07-16 NOTE — Patient Instructions (Signed)
Medication Instructions:   Continue all other medications.     Labwork:  none  Testing/Procedures:  none  Follow-Up:  Your physician wants you to follow up in:  1 year.  You should receive a recall letter in the mail about 2 months prior to the time you are due.  If you don't receive this, please call our office to schedule your follow up appointment.      Any Other Special Instructions Will Be Listed Below (If Applicable).   If you need a refill on your cardiac medications before your next appointment, please call your pharmacy.

## 2023-07-16 NOTE — Progress Notes (Signed)
 Clinical Summary Ms. Scheib is a 73 y.o.female seen today for follow up of the following medical problems.    1. LBBB - chronic for several years. Prior stress testing and echo overall unremarkable for significant cardiac disease       2. Palpitations - noted PACs and PVCs on EKG, medically managed with beta blocker. Has not had to wear a monitor  -  Limited caffeine   - rare palpitations, mild in severity.    3. HTN - medical therapy limited due to some prior orthostatic dizziness.  - she is compliant with meds       4. Reactive airway disease - compliant with inhalers      5. Vision change - optho has asked for carotid US  which we had ordered, showed minimal plaque    Past Medical History:  Diagnosis Date   Anxiety    Asthma    GERD (gastroesophageal reflux disease)    Heart palpitations    LBBB (left bundle Otto Felkins block)    Mildly reduced LVEF ~45-50%, Gr 1 DD, Mild MR & TR; 2010 Myoivew read as "mild" aical ischemia - LOW RISK (not acted upon)   RAD (reactive airway disease)     exacerbated by allergies   Seasonal allergies      Allergies  Allergen Reactions   Penicillins Itching    Did it involve swelling of the face/tongue/throat, SOB, or low BP? No Did it involve sudden or severe rash/hives, skin peeling, or any reaction on the inside of your mouth or nose? No Did you need to seek medical attention at a hospital or doctor's office? No When did it last happen?      10+ years If all above answers are "NO", may proceed with cephalosporin use.      Current Outpatient Medications  Medication Sig Dispense Refill   albuterol (PROVENTIL HFA;VENTOLIN HFA) 108 (90 BASE) MCG/ACT inhaler Inhale 1-2 puffs into the lungs every 6 (six) hours as needed for wheezing or shortness of breath.     aspirin EC 81 MG tablet Take 81 mg by mouth daily as needed.     diphenhydrAMINE (BENADRYL) 25 MG tablet Take 25 mg by mouth at bedtime as needed for allergies.      famotidine (PEPCID) 20 MG tablet Take 20 mg by mouth 2 (two) times daily.     losartan  (COZAAR ) 25 MG tablet TAKE 1 TABLET (25 MG TOTAL) BY MOUTH DAILY. 30 tablet 0   metoprolol  tartrate (LOPRESSOR ) 25 MG tablet Take 1 tablet (25 mg total) by mouth 2 (two) times daily. (May take an additional 12.5mg  (1/2 tab) as needed for palpitations.) 270 tablet 0   tiotropium (SPIRIVA) 18 MCG inhalation capsule Place 1 capsule into inhaler and inhale daily.     triamcinolone cream (KENALOG) 0.1 % Apply 1 application topically daily as needed (eczema).     fluticasone (FLONASE) 50 MCG/ACT nasal spray Place 1 spray into both nostrils daily as needed for allergies or rhinitis. (Patient not taking: Reported on 07/16/2023)     pantoprazole  (PROTONIX ) 40 MG tablet TAKE 1 TABLET BY MOUTH EVERY DAY (Patient not taking: Reported on 07/16/2023) 90 tablet 0   No current facility-administered medications for this visit.     Past Surgical History:  Procedure Laterality Date   2D Echocardiogram  08/28/2009   EF 45-50%, impaired (stage1) diastolic dysfunction, mild regurg of the mitral and tricuspid valves   BIOPSY  03/23/2018   Procedure: BIOPSY;  Surgeon:  Ruby Corporal, MD;  Location: [facility];  Service: Endoscopy;;  splenic flexure polyp   COLONOSCOPY N/A 03/23/2018   Procedure: COLONOSCOPY;  Surgeon: Ruby Corporal, MD;  Location: [facility];  Service: Endoscopy;  Laterality: N/A;  1:00   EYE SURGERY Bilateral    cataract   Lexiscan Myoview  03/26/2008   Mild ischemia in apical regions, post-stress EF 67%, nondiagnostic EKG, low-risk abnormal study     Allergies  Allergen Reactions   Penicillins Itching    Did it involve swelling of the face/tongue/throat, SOB, or low BP? No Did it involve sudden or severe rash/hives, skin peeling, or any reaction on the inside of your mouth or nose? No Did you need to seek medical attention at a hospital or doctor's office? No When did it last happen?      10+  years If all above answers are "NO", may proceed with cephalosporin use.       Family History  Problem Relation Age of Onset   Lung disease Father        Contracted in [place]   Diabetes Mother      Social History Ms. Murley reports that she has never smoked. She has been exposed to tobacco smoke. She has never used smokeless tobacco. Ms. Mcglocklin reports current alcohol  use.    Physical Examination Today's Vitals   07/16/23 1124 07/16/23 1205  BP: 132/62 112/65  Pulse: 66   SpO2: 98%   Weight: 149 lb (67.6 kg)   Height: 5\' 8"  (1.727 m)    Body mass index is 22.66 kg/m.  Gen: resting comfortably, no acute distress HEENT: no scleral icterus, pupils equal round and reactive, no palptable cervical adenopathy,  CV: RRR, no m/rg, no jvd Resp: Clear to auscultation bilaterally GI: abdomen is soft, non-tender, non-distended, normal bowel sounds, no hepatosplenomegaly MSK: extremities are warm, no edema.  Skin: warm, no rash Neuro:  no focal deficits Psych: appropriate affect   Diagnostic Studies  08/2009 echo LVEF 45-50%, grade I diastolic dysfunction, mild MR and TR   03/2008 Lexiscan: mild apical ischemia, low risk.    11/2016 echo Study Conclusions   - Left ventricle: The cavity size was normal. Wall thickness was   normal. Systolic function was normal. The estimated ejection   fraction was in the range of 50% to 55%. Wall motion was normal;   there were no regional wall motion abnormalities. Doppler   parameters are consistent with abnormal left ventricular   relaxation (grade 1 diastolic dysfunction). - Aortic valve: Mildly calcified annulus. Trileaflet. - Mitral valve: There was trivial regurgitation. - Right atrium: Central venous pressure (est): 3 mm Hg. - Atrial septum: No defect or patent foramen ovale was identified. - Tricuspid valve: There was trivial regurgitation. - Pulmonary arteries: PA peak pressure: 11 mm Hg (S). - Pericardium, extracardiac: A  prominent pericardial fat pad was   present.   Impressions:   - Normal LV wall thickness with LVEF 50-55% and grade 1 diastolic   dysfunction. Mildly calcified aortic annulus. Trivial tricuspid   regurgitation.  05/2022 carotid US  Summary:  Right Carotid: Velocities in the right ICA are consistent with a 1-39%  stenosis.   Left Carotid: The extracranial vessels were near-normal with only minimal  wall               thickening or plaque.   Vertebrals:  Bilateral vertebral arteries demonstrate antegrade flow.  Subclavians: Normal flow hemodynamics were seen in bilateral subclavian  arteries.     Assessment and Plan   1. LBBB - chronic, no major underlying structural heart disease by prior testing - EKG today shows SR, chronic LBBB   2. Palpitations -overall mild symptoms, continue lopressor . Room to titrate if needed, would be cautious due to low normal HRs and bp's.    3. HTN -at goal, continue current meds      Laurann Pollock, M.D.

## 2023-08-02 ENCOUNTER — Other Ambulatory Visit: Payer: Self-pay | Admitting: Cardiology

## 2023-08-19 DIAGNOSIS — Z01419 Encounter for gynecological examination (general) (routine) without abnormal findings: Secondary | ICD-10-CM | POA: Diagnosis not present

## 2023-08-19 DIAGNOSIS — Z124 Encounter for screening for malignant neoplasm of cervix: Secondary | ICD-10-CM | POA: Diagnosis not present

## 2023-08-19 DIAGNOSIS — Z01411 Encounter for gynecological examination (general) (routine) with abnormal findings: Secondary | ICD-10-CM | POA: Diagnosis not present

## 2023-11-08 ENCOUNTER — Other Ambulatory Visit (INDEPENDENT_AMBULATORY_CARE_PROVIDER_SITE_OTHER): Payer: Self-pay | Admitting: Gastroenterology

## 2023-11-09 NOTE — Telephone Encounter (Signed)
 Last seen 03/23/2022. Needs office visit and nothing is on the schedule for future appointments.

## 2023-12-01 ENCOUNTER — Encounter (INDEPENDENT_AMBULATORY_CARE_PROVIDER_SITE_OTHER): Payer: Self-pay | Admitting: Gastroenterology

## 2024-02-04 ENCOUNTER — Other Ambulatory Visit: Payer: Self-pay | Admitting: Cardiology

## 2024-02-25 ENCOUNTER — Encounter: Payer: Self-pay | Admitting: Cardiology

## 2024-02-28 ENCOUNTER — Other Ambulatory Visit: Payer: Self-pay | Admitting: Cardiology
